# Patient Record
Sex: Female | Born: 1945 | Race: Black or African American | Hispanic: No | Marital: Married | Smoking: Former smoker
Health system: Southern US, Community
[De-identification: ages and names within clinical notes are randomized; demographics above are authoritative.]

## PROBLEM LIST (undated history)

## (undated) DIAGNOSIS — M47812 Spondylosis without myelopathy or radiculopathy, cervical region: Secondary | ICD-10-CM

## (undated) DIAGNOSIS — D649 Anemia, unspecified: Secondary | ICD-10-CM

## (undated) DIAGNOSIS — I872 Venous insufficiency (chronic) (peripheral): Secondary | ICD-10-CM

## (undated) DIAGNOSIS — G8929 Other chronic pain: Secondary | ICD-10-CM

## (undated) DIAGNOSIS — G4700 Insomnia, unspecified: Secondary | ICD-10-CM

## (undated) DIAGNOSIS — M79669 Pain in unspecified lower leg: Secondary | ICD-10-CM

## (undated) DIAGNOSIS — J309 Allergic rhinitis, unspecified: Secondary | ICD-10-CM

## (undated) DIAGNOSIS — M25561 Pain in right knee: Secondary | ICD-10-CM

## (undated) DIAGNOSIS — E559 Vitamin D deficiency, unspecified: Secondary | ICD-10-CM

## (undated) DIAGNOSIS — M1990 Unspecified osteoarthritis, unspecified site: Secondary | ICD-10-CM

## (undated) DIAGNOSIS — R3915 Urgency of urination: Secondary | ICD-10-CM

## (undated) DIAGNOSIS — M7989 Other specified soft tissue disorders: Secondary | ICD-10-CM

## (undated) DIAGNOSIS — F411 Generalized anxiety disorder: Secondary | ICD-10-CM

## (undated) DIAGNOSIS — E785 Hyperlipidemia, unspecified: Principal | ICD-10-CM

## (undated) DIAGNOSIS — N762 Acute vulvitis: Secondary | ICD-10-CM

## (undated) HISTORY — DX: Hyperlipidemia, unspecified: E78.5

## (undated) HISTORY — DX: Vitamin D deficiency, unspecified: E55.9

## (undated) HISTORY — DX: Allergic rhinitis, unspecified: J30.9

## (undated) HISTORY — PX: OTHER SURGICAL HISTORY: SHX169

## (undated) HISTORY — DX: Other specified soft tissue disorders: M79.89

## (undated) HISTORY — DX: Pain in right knee: M25.561

## (undated) HISTORY — DX: Unspecified osteoarthritis, unspecified site: M19.90

## (undated) HISTORY — PX: TUBAL LIGATION: SHX77

## (undated) HISTORY — DX: Anemia, unspecified: D64.9

## (undated) HISTORY — DX: Spondylosis without myelopathy or radiculopathy, cervical region: M47.812

## (undated) HISTORY — DX: Acute vulvitis: N76.2

## (undated) HISTORY — DX: Other specified soft tissue disorders: M79.669

## (undated) HISTORY — DX: Venous insufficiency (chronic) (peripheral): I87.2

## (undated) HISTORY — DX: Generalized anxiety disorder: F41.1

## (undated) HISTORY — DX: Other chronic pain: G89.29

## (undated) HISTORY — DX: Insomnia, unspecified: G47.00

## (undated) HISTORY — DX: Urgency of urination: R39.15

---

## 1998-01-05 ENCOUNTER — Ambulatory Visit (HOSPITAL_COMMUNITY): Admission: RE | Admit: 1998-01-05 | Discharge: 1998-01-05 | Payer: Self-pay | Admitting: Gastroenterology

## 1998-01-31 ENCOUNTER — Other Ambulatory Visit: Admission: RE | Admit: 1998-01-31 | Discharge: 1998-01-31 | Payer: Self-pay | Admitting: Obstetrics and Gynecology

## 1998-09-29 ENCOUNTER — Other Ambulatory Visit: Admission: RE | Admit: 1998-09-29 | Discharge: 1998-09-29 | Payer: Self-pay | Admitting: Obstetrics and Gynecology

## 1999-09-02 ENCOUNTER — Encounter: Payer: Self-pay | Admitting: Emergency Medicine

## 1999-09-02 ENCOUNTER — Emergency Department (HOSPITAL_COMMUNITY): Admission: EM | Admit: 1999-09-02 | Discharge: 1999-09-02 | Payer: Self-pay | Admitting: Emergency Medicine

## 1999-11-06 ENCOUNTER — Other Ambulatory Visit: Admission: RE | Admit: 1999-11-06 | Discharge: 1999-11-06 | Payer: Self-pay | Admitting: Obstetrics and Gynecology

## 2000-05-15 ENCOUNTER — Encounter: Admission: RE | Admit: 2000-05-15 | Discharge: 2000-05-15 | Payer: Self-pay | Admitting: Obstetrics and Gynecology

## 2000-05-15 ENCOUNTER — Encounter: Payer: Self-pay | Admitting: Obstetrics and Gynecology

## 2000-05-22 ENCOUNTER — Encounter: Payer: Self-pay | Admitting: Obstetrics and Gynecology

## 2000-05-22 ENCOUNTER — Encounter: Admission: RE | Admit: 2000-05-22 | Discharge: 2000-05-22 | Payer: Self-pay | Admitting: Obstetrics and Gynecology

## 2001-01-05 ENCOUNTER — Other Ambulatory Visit: Admission: RE | Admit: 2001-01-05 | Discharge: 2001-01-05 | Payer: Self-pay | Admitting: Obstetrics and Gynecology

## 2001-07-20 ENCOUNTER — Encounter: Admission: RE | Admit: 2001-07-20 | Discharge: 2001-07-20 | Payer: Self-pay | Admitting: Obstetrics and Gynecology

## 2001-07-20 ENCOUNTER — Encounter: Payer: Self-pay | Admitting: Obstetrics and Gynecology

## 2002-06-10 ENCOUNTER — Other Ambulatory Visit: Admission: RE | Admit: 2002-06-10 | Discharge: 2002-06-10 | Payer: Self-pay | Admitting: Obstetrics and Gynecology

## 2002-07-21 ENCOUNTER — Encounter: Admission: RE | Admit: 2002-07-21 | Discharge: 2002-07-21 | Payer: Self-pay | Admitting: Obstetrics and Gynecology

## 2002-07-21 ENCOUNTER — Encounter: Payer: Self-pay | Admitting: Obstetrics and Gynecology

## 2004-01-27 ENCOUNTER — Encounter: Admission: RE | Admit: 2004-01-27 | Discharge: 2004-01-27 | Payer: Self-pay | Admitting: Obstetrics and Gynecology

## 2004-02-22 ENCOUNTER — Other Ambulatory Visit: Admission: RE | Admit: 2004-02-22 | Discharge: 2004-02-22 | Payer: Self-pay | Admitting: Obstetrics and Gynecology

## 2004-04-12 ENCOUNTER — Emergency Department (HOSPITAL_COMMUNITY): Admission: EM | Admit: 2004-04-12 | Discharge: 2004-04-12 | Payer: Self-pay | Admitting: Family Medicine

## 2004-11-07 ENCOUNTER — Ambulatory Visit (HOSPITAL_COMMUNITY): Admission: RE | Admit: 2004-11-07 | Discharge: 2004-11-07 | Payer: Self-pay | Admitting: Obstetrics and Gynecology

## 2005-03-11 ENCOUNTER — Other Ambulatory Visit: Admission: RE | Admit: 2005-03-11 | Discharge: 2005-03-11 | Payer: Self-pay | Admitting: Obstetrics and Gynecology

## 2005-04-22 ENCOUNTER — Encounter: Admission: RE | Admit: 2005-04-22 | Discharge: 2005-04-22 | Payer: Self-pay | Admitting: Obstetrics and Gynecology

## 2005-08-14 ENCOUNTER — Emergency Department (HOSPITAL_COMMUNITY): Admission: EM | Admit: 2005-08-14 | Discharge: 2005-08-14 | Payer: Self-pay | Admitting: Emergency Medicine

## 2005-09-20 ENCOUNTER — Ambulatory Visit: Payer: Self-pay | Admitting: Internal Medicine

## 2006-04-23 ENCOUNTER — Other Ambulatory Visit: Admission: RE | Admit: 2006-04-23 | Discharge: 2006-04-23 | Payer: Self-pay | Admitting: Obstetrics and Gynecology

## 2006-04-24 ENCOUNTER — Encounter: Admission: RE | Admit: 2006-04-24 | Discharge: 2006-04-24 | Payer: Self-pay | Admitting: Obstetrics and Gynecology

## 2006-05-02 ENCOUNTER — Ambulatory Visit: Payer: Self-pay | Admitting: Internal Medicine

## 2006-05-06 ENCOUNTER — Ambulatory Visit: Payer: Self-pay | Admitting: Internal Medicine

## 2006-06-05 ENCOUNTER — Ambulatory Visit: Payer: Self-pay | Admitting: Internal Medicine

## 2006-06-19 ENCOUNTER — Ambulatory Visit: Payer: Self-pay | Admitting: Internal Medicine

## 2007-04-28 ENCOUNTER — Encounter: Admission: RE | Admit: 2007-04-28 | Discharge: 2007-04-28 | Payer: Self-pay | Admitting: Obstetrics and Gynecology

## 2007-04-30 ENCOUNTER — Encounter: Admission: RE | Admit: 2007-04-30 | Discharge: 2007-04-30 | Payer: Self-pay | Admitting: Obstetrics and Gynecology

## 2008-02-11 ENCOUNTER — Emergency Department (HOSPITAL_COMMUNITY): Admission: EM | Admit: 2008-02-11 | Discharge: 2008-02-11 | Payer: Self-pay | Admitting: Emergency Medicine

## 2008-02-12 ENCOUNTER — Telehealth (INDEPENDENT_AMBULATORY_CARE_PROVIDER_SITE_OTHER): Payer: Self-pay | Admitting: *Deleted

## 2008-02-13 ENCOUNTER — Ambulatory Visit: Payer: Self-pay | Admitting: Internal Medicine

## 2008-02-23 ENCOUNTER — Ambulatory Visit: Payer: Self-pay | Admitting: Internal Medicine

## 2008-02-23 LAB — CONVERTED CEMR LAB
ALT: 16 units/L (ref 0–35)
AST: 21 units/L (ref 0–37)
Albumin: 3.9 g/dL (ref 3.5–5.2)
Alkaline Phosphatase: 66 units/L (ref 39–117)
Basophils Absolute: 0 10*3/uL (ref 0.0–0.1)
Bilirubin, Direct: 0.1 mg/dL (ref 0.0–0.3)
CO2: 32 meq/L (ref 19–32)
Cholesterol: 167 mg/dL (ref 0–200)
GFR calc non Af Amer: 90 mL/min
Glucose, Bld: 97 mg/dL (ref 70–99)
HCT: 35.5 % — ABNORMAL LOW (ref 36.0–46.0)
HDL: 54.6 mg/dL (ref >39.0)
Lymphocytes Relative: 34.4 % (ref 12.0–46.0)
Neutro Abs: 2.2 10*3/uL (ref 1.4–7.7)
Nitrite: NEGATIVE
Potassium: 4.2 meq/L (ref 3.5–5.1)
RBC: 3.65 M/uL — ABNORMAL LOW (ref 3.87–5.11)
RDW: 11.8 % (ref 11.5–14.6)
Sodium: 142 meq/L (ref 135–145)
Triglycerides: 31 mg/dL (ref 0–149)
Urine Glucose: NEGATIVE mg/dL
Urobilinogen, UA: 0.2 (ref 0.0–1.0)
WBC: 3.9 10*3/uL — ABNORMAL LOW (ref 4.5–10.5)

## 2008-02-25 ENCOUNTER — Ambulatory Visit: Payer: Self-pay | Admitting: Internal Medicine

## 2008-02-25 ENCOUNTER — Telehealth (INDEPENDENT_AMBULATORY_CARE_PROVIDER_SITE_OTHER): Payer: Self-pay | Admitting: *Deleted

## 2008-02-25 DIAGNOSIS — N3000 Acute cystitis without hematuria: Secondary | ICD-10-CM | POA: Insufficient documentation

## 2008-02-25 DIAGNOSIS — R109 Unspecified abdominal pain: Secondary | ICD-10-CM | POA: Insufficient documentation

## 2008-03-08 ENCOUNTER — Telehealth: Payer: Self-pay | Admitting: Internal Medicine

## 2008-03-24 ENCOUNTER — Ambulatory Visit: Payer: Self-pay | Admitting: Internal Medicine

## 2008-03-24 DIAGNOSIS — E785 Hyperlipidemia, unspecified: Secondary | ICD-10-CM

## 2008-03-24 DIAGNOSIS — J309 Allergic rhinitis, unspecified: Secondary | ICD-10-CM

## 2008-03-24 DIAGNOSIS — N959 Unspecified menopausal and perimenopausal disorder: Secondary | ICD-10-CM | POA: Insufficient documentation

## 2008-03-24 HISTORY — DX: Unspecified menopausal and perimenopausal disorder: N95.9

## 2008-03-24 HISTORY — DX: Hyperlipidemia, unspecified: E78.5

## 2008-03-24 HISTORY — DX: Allergic rhinitis, unspecified: J30.9

## 2008-05-24 ENCOUNTER — Encounter: Admission: RE | Admit: 2008-05-24 | Discharge: 2008-05-24 | Payer: Self-pay | Admitting: Obstetrics and Gynecology

## 2009-05-25 ENCOUNTER — Encounter: Admission: RE | Admit: 2009-05-25 | Discharge: 2009-05-25 | Payer: Self-pay | Admitting: Obstetrics and Gynecology

## 2009-05-26 LAB — CONVERTED CEMR LAB: Pap Smear: NORMAL

## 2009-06-07 ENCOUNTER — Ambulatory Visit: Payer: Self-pay | Admitting: Internal Medicine

## 2009-06-07 DIAGNOSIS — R5381 Other malaise: Secondary | ICD-10-CM

## 2009-06-07 DIAGNOSIS — R5383 Other fatigue: Secondary | ICD-10-CM

## 2009-06-07 LAB — CONVERTED CEMR LAB
Albumin: 4 g/dL (ref 3.5–5.2)
Alkaline Phosphatase: 61 units/L (ref 39–117)
Basophils Relative: 0.2 % (ref 0.0–3.0)
CO2: 30 meq/L (ref 19–32)
Cholesterol: 198 mg/dL (ref 0–200)
Eosinophils Relative: 1.6 % (ref 0.0–5.0)
HDL: 65.6 mg/dL (ref >39.00)
LDL Cholesterol: 122 mg/dL — ABNORMAL HIGH (ref 0–99)
Lymphocytes Relative: 35.8 % (ref 12.0–46.0)
Lymphs Abs: 1.4 10*3/uL (ref 0.7–4.0)
Monocytes Absolute: 0.3 10*3/uL (ref 0.1–1.0)
Monocytes Relative: 8.3 % (ref 3.0–12.0)
Neutro Abs: 2 10*3/uL (ref 1.4–7.7)
Neutrophils Relative %: 54.1 % (ref 43.0–77.0)
Nitrite: NEGATIVE
Platelets: 119 10*3/uL — ABNORMAL LOW (ref 150.0–400.0)
Potassium: 3.9 meq/L (ref 3.5–5.1)
Saturation Ratios: 34.8 % (ref 20.0–50.0)
Sodium: 142 meq/L (ref 135–145)
TSH: 2.47 microintl units/mL (ref 0.35–5.50)
Total Bilirubin: 1 mg/dL (ref 0.3–1.2)
Triglycerides: 50 mg/dL (ref 0.0–149.0)
VLDL: 10 mg/dL (ref 0.0–40.0)
Vitamin B-12: 259 pg/mL (ref 211–911)
WBC: 3.8 10*3/uL — ABNORMAL LOW (ref 4.5–10.5)

## 2010-01-24 ENCOUNTER — Ambulatory Visit: Payer: Self-pay | Admitting: Internal Medicine

## 2010-01-24 DIAGNOSIS — G4700 Insomnia, unspecified: Secondary | ICD-10-CM

## 2010-01-24 DIAGNOSIS — F411 Generalized anxiety disorder: Secondary | ICD-10-CM | POA: Insufficient documentation

## 2010-01-24 HISTORY — DX: Generalized anxiety disorder: F41.1

## 2010-01-24 HISTORY — DX: Insomnia, unspecified: G47.00

## 2010-05-29 ENCOUNTER — Ambulatory Visit: Payer: Self-pay | Admitting: Internal Medicine

## 2010-05-29 LAB — CONVERTED CEMR LAB
ALT: 16 units/L (ref 0–35)
AST: 25 units/L (ref 0–37)
Albumin: 4.1 g/dL (ref 3.5–5.2)
Alkaline Phosphatase: 75 units/L (ref 39–117)
Basophils Absolute: 0 10*3/uL (ref 0.0–0.1)
Bilirubin, Direct: 0.1 mg/dL (ref 0.0–0.3)
CO2: 31 meq/L (ref 19–32)
Calcium: 9.9 mg/dL (ref 8.4–10.5)
Chloride: 106 meq/L (ref 96–112)
Direct LDL: 116.3 mg/dL
Hemoglobin: 12.1 g/dL (ref 12.0–15.0)
Lymphs Abs: 1.2 10*3/uL (ref 0.7–4.0)
MCHC: 33 g/dL (ref 30.0–36.0)
MCV: 99 fL (ref 78.0–100.0)
Monocytes Absolute: 0.2 10*3/uL (ref 0.1–1.0)
Monocytes Relative: 6.7 % (ref 3.0–12.0)
Potassium: 4.4 meq/L (ref 3.5–5.1)
RDW: 13.2 % (ref 11.5–14.6)
Sodium: 143 meq/L (ref 135–145)
Total CHOL/HDL Ratio: 3
Total Protein: 7.5 g/dL (ref 6.0–8.3)
VLDL: 7.2 mg/dL (ref 0.0–40.0)

## 2010-06-12 ENCOUNTER — Encounter: Admission: RE | Admit: 2010-06-12 | Discharge: 2010-06-12 | Payer: Self-pay | Admitting: Obstetrics and Gynecology

## 2010-07-27 ENCOUNTER — Ambulatory Visit: Payer: Self-pay | Admitting: Internal Medicine

## 2010-07-27 DIAGNOSIS — J019 Acute sinusitis, unspecified: Secondary | ICD-10-CM

## 2010-07-27 HISTORY — DX: Acute sinusitis, unspecified: J01.90

## 2010-09-15 ENCOUNTER — Encounter: Payer: Self-pay | Admitting: Obstetrics and Gynecology

## 2010-09-25 NOTE — Assessment & Plan Note (Signed)
Summary: HEADACHE FATIGUE---STC   Vital Signs:  Patient profile:   65 year old female Height:      65.5 inches Weight:      190 pounds BMI:     31.25 O2 Sat:      97 % on Room air Temp:     96.8 degrees F oral Pulse rate:   67 / minute BP sitting:   132 / 80  (left arm) Cuff size:   regular  Vitals Entered ByZella Ball Ewing (January 24, 2010 10:31 AM)  O2 Flow:  Room air CC: Headaches, fatigue/RE   CC:  Headaches and fatigue/RE.  History of Present Illness: here with ongoing ? worsening fatigue and headches for 3 wks, without obvious eitology it seems;  has had marked increase stress and insomnia, but denies worsening depressive symptoms, suicidal ideation or panic;  denies fever, ST, neck pain or stiffness, cough and Pt denies CP, sob, doe, wheezing, orthopnea, pnd, worsening LE edema, palps, dizziness or syncope Pt denies new neuro symptoms such as headache, facial or extremity weakness   No chills, n/v/d, joint pains, wt loss or night sweats.  Has recent recurrent hives controlled with xyzal as needed, but no new meds, or exposures;  no tongue swelling or wheezing.    Problems Prior to Update: 1)  Insomnia-sleep Disorder-unspec  (ICD-780.52) 2)  Anxiety  (ICD-300.00) 3)  Fatigue  (ICD-780.79) 4)  Menopausal Disorder  (ICD-627.9) 5)  Preventive Health Care  (ICD-V70.0) 6)  Hyperlipidemia  (ICD-272.4) 7)  Allergic Rhinitis  (ICD-477.9) 8)  Abdominal Pain, Unspecified Site  (ICD-789.00) 9)  Acute Cystitis  (ICD-595.0)  Medications Prior to Update: 1)  Aspir-Low 81 Mg Tbec (Aspirin) .Marland Kitchen.. 1 By Mouth Once Daily 2)  Prometrium 100 Mg Caps (Progesterone Micronized) .Marland Kitchen.. 1 By Mouth Once Daily 3)  Nuvaring 0.12-0.015 Mg/24hr Ring (Etonogestrel-Ethinyl Estradiol) .... Use Asd 1 Q 3 Months  Current Medications (verified): 1)  Aspir-Low 81 Mg Tbec (Aspirin) .Marland Kitchen.. 1 By Mouth Once Daily 2)  Prometrium 100 Mg Caps (Progesterone Micronized) .Marland Kitchen.. 1 By Mouth Once Daily 3)  Nuvaring 0.12-0.015  Mg/24hr Ring (Etonogestrel-Ethinyl Estradiol) .... Use Asd 1 Q 3 Months 4)  Xyzal 5 Mg Tabs (Levocetirizine Dihydrochloride) .Marland Kitchen.. 1 By Mouth Once Daily 5)  Fluoxetine Hcl 20 Mg Caps (Fluoxetine Hcl) .Marland Kitchen.. 1po Once Daily 6)  Zolpidem Tartrate 12.5 Mg Cr-Tabs (Zolpidem Tartrate) .Marland Kitchen.. 1 By Mouth At Bedtime As Needed  Allergies (verified): 1)  ! * Tetanus  Past History:  Past Surgical History: Last updated: 03/24/2008 Denies surgical history  Social History: Last updated: 03/24/2008 Former Smoker Alcohol use-no 2 children Married retired Psychologist, occupational  Risk Factors: Smoking Status: quit (03/24/2008)  Past Medical History: Allergic rhinitis Hyperlipidemia c-spine DJD Anxiety  Review of Systems       all otherwise negative per pt -    Physical Exam  General:  alert and overweight-appearing.   Head:  normocephalic and atraumatic.   Eyes:  vision grossly intact, pupils equal, and pupils round.   Ears:  R ear normal and L ear normal.   Nose:  no external deformity and no nasal discharge.   Mouth:  no gingival abnormalities and pharynx pink and moist.   Neck:  supple and no masses.   Lungs:  normal respiratory effort and normal breath sounds.   Heart:  normal rate and regular rhythm.   Abdomen:  soft, non-tender, normal bowel sounds, and no splenomegaly.   Msk:  no joint tenderness and no joint  swelling.   Extremities:  no edema, no erythema  Neurologic:  cranial nerves II-XII intact and strength normal in all extremities.   Skin:  no rashes.     Impression & Recommendations:  Problem # 1:  ANXIETY (ICD-300.00)  Her updated medication list for this problem includes:    Fluoxetine Hcl 20 Mg Caps (Fluoxetine hcl) .Marland Kitchen... 1po once daily treat as above, f/u any worsening signs or symptoms   Problem # 2:  INSOMNIA-SLEEP DISORDER-UNSPEC (ICD-780.52)  Her updated medication list for this problem includes:    Zolpidem Tartrate 12.5 Mg Cr-tabs (Zolpidem tartrate) .Marland Kitchen... 1 by mouth  at bedtime as needed treat as above, f/u any worsening signs or symptoms   Problem # 3:  FATIGUE (ICD-780.79) exam benign, last visit labs reviewed with pt; follow with expectant management   Problem # 4:  HYPERLIPIDEMIA (ICD-272.4)  Labs Reviewed: SGOT: 21 (06/07/2009)   SGPT: 15 (06/07/2009)   HDL:65.60 (06/07/2009), 54.6 (02/23/2008)  LDL:122 (06/07/2009), 106 (02/23/2008)  Chol:198 (06/07/2009), 167 (02/23/2008)  Trig:50.0 (06/07/2009), 31 (02/23/2008) to cont diet for now  Complete Medication List: 1)  Aspir-low 81 Mg Tbec (Aspirin) .Marland Kitchen.. 1 by mouth once daily 2)  Prometrium 100 Mg Caps (Progesterone micronized) .Marland Kitchen.. 1 by mouth once daily 3)  Nuvaring 0.12-0.015 Mg/24hr Ring (Etonogestrel-ethinyl estradiol) .... Use asd 1 q 3 months 4)  Xyzal 5 Mg Tabs (Levocetirizine dihydrochloride) .Marland Kitchen.. 1 by mouth once daily 5)  Fluoxetine Hcl 20 Mg Caps (Fluoxetine hcl) .Marland Kitchen.. 1po once daily 6)  Zolpidem Tartrate 12.5 Mg Cr-tabs (Zolpidem tartrate) .Marland Kitchen.. 1 by mouth at bedtime as needed  Patient Instructions: 1)  Please take all new medications as prescribed 2)  Continue all previous medications as before this visit  3)  please follow as low cholesterol diet as you can 4)  Please schedule a follow-up appointment in 4 months with CPX labs Prescriptions: ZOLPIDEM TARTRATE 12.5 MG CR-TABS (ZOLPIDEM TARTRATE) 1 by mouth at bedtime as needed  #30 x 5   Entered and Authorized by:   Corwin Levins MD   Signed by:   Corwin Levins MD on 01/24/2010   Method used:   Print then Give to Patient   RxID:   [id number] FLUOXETINE HCL 20 MG CAPS (FLUOXETINE HCL) 1po once daily  #30 x 11   Entered and Authorized by:   Corwin Levins MD   Signed by:   Corwin Levins MD on 01/24/2010   Method used:   Print then Give to Patient   RxID:   [id number]

## 2010-09-25 NOTE — Assessment & Plan Note (Signed)
Summary: YEARLY FU/ MEDICARE / [employer] [place]   #   Vital Signs:  Patient profile:   65 year old female Height:      65.5 inches Weight:      191.25 pounds BMI:     31.45 O2 Sat:      98 % on Room air Temp:     98 degrees F oral Pulse rate:   64 / minute BP sitting:   100 / 62  (left arm) Cuff size:   regular  Vitals Entered By: Zella Ball Ewing CMA Duncan Dull) (May 29, 2010 10:52 AM)  O2 Flow:  Room air  Preventive Care Screening     has appt for f/u mammogram next wk  CC: yearly/RE/wellness   CC:  yearly/RE/wellness.  History of Present Illness: here for wellness and f/u - never took the prozac and does not want to try at this time;  denies worsening depressive symptoms, suicidal ideation, anixety or panic.  Sleep issues resolves as well and the dose of zolpidem 12.5 cuased hangover the next day iwth grogginess;  Pt denies CP, worsening sob, doe, wheezing, orthopnea, pnd, worsening LE edema, palps, dizziness or syncope  Pt denies new neuro symptoms such as headache, facial or extremity weakness  Pt denies polydipsia, polyuria.  Overall good compliance with meds, trying to follow low chol, diet, wt stable, little excercise however  No fever, wt loss, night sweats, loss of appetite or other constitutional symptoms  Has good results with the now generic xyzal as needed for nasall allergy symtpoms.  Denies hypersomnia or snore at night  Here for wellness Diet: Heart Healthy or DM if diabetic Physical Activities: Sedentary, but does go to the gym a few times per wk - treadmill, walking trails Depression/mood screen: Negative Hearing: Intact bilateral Visual Acuity: Grossly normal, gets exam yearly, wears glasses/nersighted ADL's: Capable  Fall Risk: None Home Safety: Good Cognitive Impairment:  Gen appearance, affect, speech, memory, attention & motor skills grossly intact End-of-Life Planning: Advance directive - Full code/I agree   Preventive Screening-Counseling & Management     Drug Use:  no.    Problems Prior to Update: 1)  Insomnia-sleep Disorder-unspec  (ICD-780.52) 2)  Anxiety  (ICD-300.00) 3)  Fatigue  (ICD-780.79) 4)  Menopausal Disorder  (ICD-627.9) 5)  Preventive Health Care  (ICD-V70.0) 6)  Hyperlipidemia  (ICD-272.4) 7)  Allergic Rhinitis  (ICD-477.9) 8)  Abdominal Pain, Unspecified Site  (ICD-789.00) 9)  Acute Cystitis  (ICD-595.0)  Medications Prior to Update: 1)  Aspir-Low 81 Mg Tbec (Aspirin) .Marland Kitchen.. 1 By Mouth Once Daily 2)  Prometrium 100 Mg Caps (Progesterone Micronized) .Marland Kitchen.. 1 By Mouth Once Daily 3)  Nuvaring 0.12-0.015 Mg/24hr Ring (Etonogestrel-Ethinyl Estradiol) .... Use Asd 1 Q 3 Months 4)  Xyzal 5 Mg Tabs (Levocetirizine Dihydrochloride) .Marland Kitchen.. 1 By Mouth Once Daily 5)  Fluoxetine Hcl 20 Mg Caps (Fluoxetine Hcl) .Marland Kitchen.. 1po Once Daily 6)  Zolpidem Tartrate 12.5 Mg Cr-Tabs (Zolpidem Tartrate) .Marland Kitchen.. 1 By Mouth At Bedtime As Needed  Current Medications (verified): 1)  Aspir-Low 81 Mg Tbec (Aspirin) .Marland Kitchen.. 1 By Mouth Once Daily 2)  Prometrium 100 Mg Caps (Progesterone Micronized) .Marland Kitchen.. 1 By Mouth Once Daily 3)  Nuvaring 0.12-0.015 Mg/24hr Ring (Etonogestrel-Ethinyl Estradiol) .... Use Asd 1 Q 3 Months 4)  Xyzal 5 Mg Tabs (Levocetirizine Dihydrochloride) .Marland Kitchen.. 1 By Mouth Once Daily  Allergies (verified): 1)  ! * Tetanus  Past History:  Family History: Last updated: 03/24/2008 mother with breast cancer sister with uterine cancer  Social  History: Last updated: 05/29/2010 Former Smoker Alcohol use-no 2 children Married retired Psychologist, occupational Drug use-no  Risk Factors: Smoking Status: quit (03/24/2008)  Past Medical History: Allergic rhinitis Hyperlipidemia c-spine DJD Anxiety  MD roster:  optho:  Dr Gwen Pounds                   GYN:  Dr Pennie Rushing                   Derm:  dr Joseph Art                   ortho:  Dr Darrelyn Hillock                   GI:  Dr Juanda Chance  Past Surgical History: Reviewed history from 03/24/2008 and no changes required. Denies  surgical history  Family History: Reviewed history from 03/24/2008 and no changes required. mother with breast cancer sister with uterine cancer  Social History: Reviewed history from 03/24/2008 and no changes required. 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         all otherwise negative per pt -  except has some Left lower back pain now resolved today in the past 3 days  Physical Exam  General:  alert and overweight-appearing.   Head:  normocephalic and atraumatic.   Eyes:  vision grossly intact, pupils equal, and pupils round.   Ears:  R ear normal and L ear normal.   Nose:  no external deformity and no nasal discharge.   Mouth:  no gingival abnormalities and pharynx pink and moist.   Neck:  supple and no masses.   Lungs:  normal respiratory effort and normal breath sounds.   Heart:  normal rate and regular rhythm.   Abdomen:  soft, non-tender, and normal bowel sounds.   Msk:  no joint tenderness and no joint swelling.   Extremities:  no edema, no erythema  Neurologic:  cognitive intact to orientation, recall, naming, and repetition , cranial nerves II-XII intact and strength normal in all extremities.   Skin:  color normal and no rashes.   Psych:  not depressed appearing and slightly anxious.     Impression & Recommendations:  Problem # 1:  Preventive Health Care (ICD-V70.0)  Overall doing well, age appropriate education and counseling updated and referral for appropriate  preventive services done unless declined, immunizations up to date or declined, diet counseling done if overweight, urged to quit smoking if smokes , most recent labs reviewed and current ordered if appropriate, ecg reviewed or declined (interpretation per ECG scanned in the EMR if done); information regarding Medicare Prevention requirements given if appropriate; speciality referrals updated as appropriate   Orders: Medicare -1st Annual Wellness Visit [phone])  Problem # 2:  FATIGUE (ICD-780.79)  exam benign, to check labs below; follow with expectant management  - likely multifactorial due to comorbids  Orders: TLB-BMP (Basic Metabolic Panel-BMET) (80048-METABOL) TLB-CBC Platelet - w/Differential (85025-CBCD) TLB-Hepatic/Liver Function Pnl (80076-HEPATIC) TLB-TSH (Thyroid Stimulating Hormone) (84443-TSH) TLB-Sedimentation Rate (ESR) (85652-ESR)  Problem # 3:  HYPERLIPIDEMIA (ICD-272.4)  Labs Reviewed: SGOT: 21 (06/07/2009)   SGPT: 15 (06/07/2009)  HDL:65.60 (06/07/2009), 54.6 (02/23/2008)  LDL:122 (06/07/2009), 106 (02/23/2008)  Chol:198 (06/07/2009), 167 (02/23/2008)  Trig:50.0 (06/07/2009), 31 (02/23/2008) mild, Pt to continue diet efforts,; to check labs - goal LDL less than 100  Orders: TLB-Lipid Panel (80061-LIPID)  Problem # 4:  INSOMNIA-SLEEP DISORDER-UNSPEC (ICD-780.52)  The following medications were removed from the medication list:    Zolpidem Tartrate 12.5 Mg Cr-tabs (Zolpidem tartrate) .Marland Kitchen... 1 by mouth at bedtime as needed resolved - stable overall by hx and exam, ok to continue meds/tx as is  - none needed at this time  Problem # 5:  ANXIETY (ICD-300.00)  The following medications were removed from the medication list:    Fluoxetine Hcl 20 Mg Caps (Fluoxetine hcl) .Marland Kitchen... 1po once daily stable overall by hx and exam, ok to continue meds/tx as is - declines further meds at this time  Problem # 6:  ALLERGIC RHINITIS (ICD-477.9)  Her updated medication list for this  problem includes:    Xyzal 5 Mg Tabs (Levocetirizine dihydrochloride) .Marland Kitchen... 1 by mouth once daily stable overall by hx and exam, ok to continue meds/tx as is   Orders: Prescription Created Electronically [phone])  Complete Medication List: 1)  Aspir-low 81 Mg Tbec (Aspirin) .Marland Kitchen.. 1 by mouth once daily 2)  Prometrium 100 Mg Caps (Progesterone micronized) .Marland Kitchen.. 1 by mouth once daily 3)  Nuvaring 0.12-0.015 Mg/24hr Ring (Etonogestrel-ethinyl estradiol) .... Use asd 1 q 3 months 4)  Xyzal 5 Mg Tabs (Levocetirizine dihydrochloride) .Marland Kitchen.. 1 by mouth once daily  Other Orders: Flu Vaccine 91yrs + MEDICARE PATIENTS (K7425) Administration Flu vaccine - MCR (Z5638)  Patient Instructions: 1)  you had the flu shot today 2)  Please go to the Lab in the basement for your blood and/or urine tests today 3)  Please call the number on the [facility] for results of your testing  4)  Continue all previous medications as before this visit  5)  Please schedule a follow-up appointment in 1 yr, or sooner if needed Prescriptions: XYZAL 5 MG TABS (LEVOCETIRIZINE DIHYDROCHLORIDE) 1 by mouth once daily  #90 x 3   Entered and Authorized by:   Corwin Levins MD   Signed by:   Corwin Levins MD on 05/29/2010   Method used:   Print then Give to Patient   RxID:   [id number]   Flu Vaccine Consent Questions     Do you have a history of severe allergic reactions to this vaccine? no    Any prior history of allergic reactions to egg and/or gelatin? no    Do you have a sensitivity to the preservative Thimersol? no    Do you have a past history of Guillan-Barre Syndrome? no    Do you currently have an acute febrile illness? no    Have you ever had a severe reaction to latex? no    Vaccine information given and explained to patient? yes    Are you currently pregnant? no    Lot Number:[id number]   Exp Date:02/23/2011   Site Given  Left Deltoid IMdflu

## 2010-09-25 NOTE — Assessment & Plan Note (Signed)
Summary: sore throat/congested/cd   Vital Signs:  Patient profile:   65 year old female Height:      65.5 inches Weight:      188.38 pounds BMI:     30.98 O2 Sat:      93 % on Room air Temp:     99.1 degrees F oral Pulse rate:   78 / minute BP sitting:   100 / 62  (left arm) Cuff size:   regular  Vitals Entered By: Zella Ball Ewing CMA Duncan Dull) (July 27, 2010 2:40 PM)  O2 Flow:  Room air CC: Sore Throat, congestion/RE   CC:  Sore Throat and congestion/RE.  History of Present Illness: here with acute onset 3 days fever, headache, ST, sinus pain, pressure, and greenish d/c;  and today with increased prod cough greenish/yellow sputum as well, .Pt denies CP, worsening sob, doe, wheezing, orthopnea, pnd, worsening LE edema, palps, dizziness or syncope  Pt denies new neuro symptoms such as headache, facial or extremity weakness  Pt denies polydipsia, polyuria  Overall good compliance with meds, trying to follow low chol diet, wt stable, little excercise however  Has had over a month of nasal allergy symtpoms as well with clearish drainaage, without pain or fever.  Trying to follow lower chol diet.  Problems Prior to Update: 1)  Bronchitis-acute  (ICD-466.0) 2)  Sinusitis- Acute-nos  (ICD-461.9) 3)  Insomnia-sleep Disorder-unspec  (ICD-780.52) 4)  Anxiety  (ICD-300.00) 5)  Fatigue  (ICD-780.79) 6)  Menopausal Disorder  (ICD-627.9) 7)  Preventive Health Care  (ICD-V70.0) 8)  Hyperlipidemia  (ICD-272.4) 9)  Allergic Rhinitis  (ICD-477.9) 10)  Abdominal Pain, Unspecified Site  (ICD-789.00) 11)  Acute Cystitis  (ICD-595.0)  Medications Prior to Update: 1)  Aspir-Low 81 Mg Tbec (Aspirin) .Marland Kitchen.. 1 By Mouth Once Daily 2)  Prometrium 100 Mg Caps (Progesterone Micronized) .Marland Kitchen.. 1 By Mouth Once Daily 3)  Nuvaring 0.12-0.015 Mg/24hr Ring (Etonogestrel-Ethinyl Estradiol) .... Use Asd 1 Q 3 Months 4)  Xyzal 5 Mg Tabs (Levocetirizine Dihydrochloride) .Marland Kitchen.. 1 By Mouth Once Daily  Current Medications  (verified): 1)  Aspir-Low 81 Mg Tbec (Aspirin) .Marland Kitchen.. 1 By Mouth Once Daily 2)  Prometrium 100 Mg Caps (Progesterone Micronized) .Marland Kitchen.. 1 By Mouth Once Daily 3)  Nuvaring 0.12-0.015 Mg/24hr Ring (Etonogestrel-Ethinyl Estradiol) .... Use Asd 1 Q 3 Months 4)  Xyzal 5 Mg Tabs (Levocetirizine Dihydrochloride) .Marland Kitchen.. 1 By Mouth Once Daily 5)  Azithromycin 250 Mg Tabs (Azithromycin) .... 2po Qd For 1 Day, Then 1po Qd For 4days, Then Stop 6)  Tessalon Perles 100 Mg Caps (Benzonatate) .Marland Kitchen.. 1-2 By Mouth Three Times A Day As Needed  Allergies (verified): 1)  ! 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   Eyes:  vision grossly intact, pupils equal, and pupils round.   Ears:  bilat tm's red, sinus tender Nose:  nasal dischargemucosal pallor and mucosal edema.   Mouth:  pharyngeal erythema and fair dentition.   Neck:  supple and cervical lymphadenopathy.   Lungs:  normal respiratory effort and normal breath sounds.   Heart:  normal rate and regular rhythm.   Extremities:  no edema, no erythema    Impression & Recommendations:  Problem # 1:  BRONCHITIS-ACUTE (ICD-466.0) Assessment New  Her updated medication list for this problem includes:    Azithromycin 250 Mg Tabs (Azithromycin) .Marland Kitchen... 2po qd for 1 day, then 1po qd for  4days, then stop    Tessalon Perles 100 Mg Caps (Benzonatate) .Marland Kitchen... 1-2 by mouth three times a day as needed treat as above, f/u any worsening signs or symptoms   Problem # 2:  SINUSITIS- ACUTE-NOS (ICD-461.9)  Her updated medication list for this problem includes:    Azithromycin 250 Mg Tabs (Azithromycin) .Marland Kitchen... 2po qd for 1 day, then 1po qd for 4days, then stop    Tessalon Perles 100 Mg Caps (Benzonatate) .Marland Kitchen... 1-2 by mouth three times a day as needed treat as above, f/u any worsening signs or symptoms   Problem # 3:  ALLERGIC RHINITIS (ICD-477.9)  Her updated medication list for this problem includes:    Xyzal 5 Mg Tabs (Levocetirizine dihydrochloride) .Marland Kitchen... 1 by mouth once daily to re-start med, o/w stable overall by hx and exam, ok to continue meds/tx as is   Problem # 4:  HYPERLIPIDEMIA (ICD-272.4)  Labs Reviewed: SGOT: 25 (05/29/2010)   SGPT: 16 (05/29/2010)   HDL:80.80 (05/29/2010), 65.60 (06/07/2009)  LDL:122 (06/07/2009), 106 (02/23/2008)  Chol:205 (05/29/2010), 198 (06/07/2009)  Trig:36.0 (05/29/2010), 50.0 (06/07/2009) d/w pt - declines statin - for lower chol diet, Pt to continue diet efforts,  to check labs next visit - goal LDL less than 100  Complete Medication List: 1)  Aspir-low 81 Mg Tbec (Aspirin) .Marland Kitchen.. 1 by mouth once daily 2)  Prometrium 100 Mg Caps (Progesterone micronized) .Marland Kitchen.. 1 by mouth once daily 3)  Nuvaring 0.12-0.015 Mg/24hr Ring (Etonogestrel-ethinyl estradiol) .... Use asd 1 q 3 months 4)  Xyzal 5 Mg Tabs (Levocetirizine dihydrochloride) .Marland Kitchen.. 1 by mouth once daily 5)  Azithromycin 250 Mg Tabs (Azithromycin) .... 2po qd for 1 day, then 1po qd for 4days, then stop 6)  Tessalon Perles 100 Mg Caps (Benzonatate) .Marland Kitchen.. 1-2 by mouth three times a day as needed  Patient Instructions: 1)  Please take all new medications as prescribed 2)  Continue all previous medications as before this visit  3)  Please follow lower chol diet 4)  You can also use Mucinex OTC  or it's generic for congestion  5)  Please schedule a follow-up appointment as needed. Prescriptions: TESSALON PERLES 100 MG CAPS (BENZONATATE) 1-2 by mouth three times a day as needed  #60 x 0   Entered and Authorized by:   Corwin Levins MD   Signed by:   Corwin Levins MD on 07/27/2010   Method used:   Print then Give to Patient   RxID:   [id number] AZITHROMYCIN 250 MG TABS (AZITHROMYCIN) 2po qd for 1 day, then 1po qd for 4days, then  stop  #6 x 1   Entered and Authorized by:   Corwin Levins MD   Signed by:   Corwin Levins MD on 07/27/2010   Method used:   Print then Give to Patient   RxID:   [id number]    Orders Added: 1)  Est. Patient Level IV [08657]

## 2010-10-02 ENCOUNTER — Encounter: Payer: Self-pay | Admitting: Internal Medicine

## 2010-10-02 ENCOUNTER — Ambulatory Visit (INDEPENDENT_AMBULATORY_CARE_PROVIDER_SITE_OTHER): Payer: Medicare Other | Admitting: Internal Medicine

## 2010-10-02 DIAGNOSIS — F411 Generalized anxiety disorder: Secondary | ICD-10-CM

## 2010-10-02 DIAGNOSIS — M79609 Pain in unspecified limb: Secondary | ICD-10-CM

## 2010-10-02 DIAGNOSIS — M549 Dorsalgia, unspecified: Secondary | ICD-10-CM

## 2010-10-02 HISTORY — DX: Dorsalgia, unspecified: M54.9

## 2010-10-11 NOTE — Assessment & Plan Note (Signed)
Summary: PAIN IN BACK AND HIP/NWS  #   Vital Signs:  Patient profile:   65 year old female Height:      65.5 inches Weight:      191.75 pounds BMI:     31.54 O2 Sat:      96 % on Room air Temp:     97.7 degrees F oral Pulse rate:   65 / minute BP sitting:   110 / 60  (left arm) Cuff size:   regular  Vitals Entered By: Zella Ball Ewing CMA Duncan Dull) (October 02, 2010 3:44 PM)  O2 Flow:  Room air CC: Back and hip pain/RE   CC:  Back and hip pain/RE.  History of Present Illness: here to f/u with acute c/o pain to the right lower back x 5 days, mod to severe with flares that immobilize her due to severity,  worse to get up from sitting, radiates to right lateral hip area and groin,  sharp,  nothing makes better except for sitting adn lying,  no bowel or bladder changes, no LE distal pain, weakness or numbness, gait change or fall .  does need help to get up out of the car.  Has not tried OTC meds or other such as heating pad.  No prior hx of back pain similar, no prior MRI, surgical eval, PT or ESI treatments.     Pt denies CP, worsening sob, doe, wheezing, orthopnea, pnd, worsening LE edema, palps, dizziness or syncope  Pt denies new neuro symptoms such as headache, facial or extremity weakness  Pt denies polydipsia, polyuria.  Denies worsening depressive symptoms, suicidal ideation, or panic., though has ongoing anxiety.  Also has 4 wks ongoing right bicep area tenderness and discomfort , but only hurts to fully extend the arm, or try reach behind her back. No neck pain, radicular symtpoms, or RUE pain/weak/numb or less grip strength.    Problems Prior to Update: 1)  Arm Pain, Right  (ICD-729.5) 2)  Back Pain  (ICD-724.5) 3)  Sinusitis- Acute-nos  (ICD-461.9) 4)  Insomnia-sleep Disorder-unspec  (ICD-780.52) 5)  Anxiety  (ICD-300.00) 6)  Fatigue  (ICD-780.79) 7)  Menopausal Disorder  (ICD-627.9) 8)  Preventive Health Care  (ICD-V70.0) 9)  Hyperlipidemia  (ICD-272.4) 10)  Allergic  Rhinitis  (ICD-477.9) 11)  Abdominal Pain, Unspecified Site  (ICD-789.00) 12)  Acute Cystitis  (ICD-595.0)  Medications Prior to Update: 1)  Aspir-Low 81 Mg Tbec (Aspirin) .Marland Kitchen.. 1 By Mouth Once Daily 2)  Prometrium 100 Mg Caps (Progesterone Micronized) .Marland Kitchen.. 1 By Mouth Once Daily 3)  Nuvaring 0.12-0.015 Mg/24hr Ring (Etonogestrel-Ethinyl Estradiol) .... Use Asd 1 Q 3 Months 4)  Xyzal 5 Mg Tabs (Levocetirizine Dihydrochloride) .Marland Kitchen.. 1 By Mouth Once Daily 5)  Azithromycin 250 Mg Tabs (Azithromycin) .... 2po Qd For 1 Day, Then 1po Qd For 4days, Then Stop 6)  Tessalon Perles 100 Mg Caps (Benzonatate) .Marland Kitchen.. 1-2 By Mouth Three Times A Day As Needed  Current Medications (verified): 1)  Aspir-Low 81 Mg Tbec (Aspirin) .Marland Kitchen.. 1 By Mouth Once Daily 2)  Prometrium 100 Mg Caps (Progesterone Micronized) .Marland Kitchen.. 1 By Mouth Once Daily 3)  Nuvaring 0.12-0.015 Mg/24hr Ring (Etonogestrel-Ethinyl Estradiol) .... Use Asd 1 Q 3 Months 4)  Xyzal 5 Mg Tabs (Levocetirizine Dihydrochloride) .Marland Kitchen.. 1 By Mouth Once Daily 5)  Ultram Er 100 Mg Xr24h-Tab (Tramadol Hcl) .Marland Kitchen.. 1 - 2 By Mouth Once Daily As Needed For Pain 6)  Flexeril 5 Mg Tabs (Cyclobenzaprine Hcl) .Marland Kitchen.. 1 By Mouth Three Times A  Day As Needed Pain 7)  Prednisone 10 Mg Tabs (Prednisone) .... 3po Qd For 3days, Then 2po Qd For 3days, Then 1po Qd For 3days, Then Stop  Allergies (verified): 1)  ! * Tetanus  Past History:  Past Medical History: Last updated: 05/29/2010 Allergic rhinitis Hyperlipidemia c-spine DJD Anxiety  MD roster:  optho:  Dr Gwen Pounds                   GYN:  Dr Pennie Rushing                   Derm:  dr Joseph Art                   ortho:  Dr Darrelyn Hillock                   GI:  Dr Juanda Chance  Past Surgical History: Last updated: 03/24/2008 Denies surgical history  Social History: Last updated: 05/29/2010 Former Smoker Alcohol use-no 2 children Married retired Psychologist, occupational Drug use-no  Risk Factors: Smoking Status: quit (03/24/2008)  Review of Systems        all otherwise negative per pt -    Physical Exam  General:  alert and overweight-appearing Head:  normocephalic and atraumatic.   Eyes:  vision grossly intact, pupils equal, and pupils round.   Ears:  R ear normal and L ear normal.   Nose:  no external deformity and no nasal discharge.   Mouth:  no gingival abnormalities and pharynx pink and moist.   Neck:  supple and no masses.   Lungs:  normal respiratory effort and normal breath sounds.   Heart:  normal rate and regular rhythm.   Abdomen:  soft, non-tender, and normal bowel sounds.   Msk:  no joint tenderness and no joint swelling., and no spine or paraspinal tender, sweling , rash  ;  no pain on knee and hip ROM testing;  does have some tender to bicep insertion sites at the elbow without swelling or rash Extremities:  no edema, no erythema  Neurologic:  strength normal in all extremities, sensation intact to light touch, gait normal, and DTRs symmetrical and normal.except mild diminished right patellar Skin:  color normal and no rashes.   Psych:  not depressed appearing and slightly anxious.     Impression & Recommendations:  Problem # 1:  BACK PAIN (ICD-724.5)  Her updated medication list for this problem includes:    Aspir-low 81 Mg Tbec (Aspirin) .Marland Kitchen... 1 by mouth once daily    Ultram Er 100 Mg Xr24h-tab (Tramadol hcl) .Marland Kitchen... 1 - 2 by mouth once daily as needed for pain    Flexeril 5 Mg Tabs (Cyclobenzaprine hcl) .Marland Kitchen... 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 1 by mouth once daily 2)  Prometrium 100 Mg Caps (Progesterone micronized) .Marland KitchenMarland KitchenMarland Kitchen  1 by mouth once daily 3)  Nuvaring 0.12-0.015 Mg/24hr Ring (Etonogestrel-ethinyl estradiol) .... Use asd 1 q 3 months 4)  Xyzal 5 Mg Tabs (Levocetirizine dihydrochloride) .Marland Kitchen.. 1 by mouth once daily 5)  Ultram Er 100 Mg Xr24h-tab (Tramadol hcl) .Marland Kitchen.. 1 - 2 by mouth once daily as needed for pain 6)  Flexeril 5 Mg Tabs (Cyclobenzaprine hcl) .Marland Kitchen.. 1 by mouth three times a day as needed pain 7)  Prednisone 10 Mg Tabs (Prednisone) .... 3po qd for 3days, then 2po qd for 3days, then 1po qd for 3days, then stop  Patient Instructions: 1)  Please take all new medications as prescribed 2)  Continue all previous medications as before this visit 3)  Please schedule a follow-up appointment in 6 months or sooner if  needed Prescriptions: PREDNISONE 10 MG TABS (PREDNISONE) 3po qd for 3days, then 2po qd for 3days, then 1po qd for 3days, then stop  #18 x 0   Entered and Authorized by:   Corwin Levins MD   Signed by:   Corwin Levins MD on 10/02/2010   Method used:   Print then Give to Patient   RxID:   [id number] FLEXERIL 5 MG TABS (CYCLOBENZAPRINE HCL) 1 by mouth three times a day as needed pain  #90 x 0   Entered and Authorized by:   Corwin Levins MD   Signed by:   Corwin Levins MD on 10/02/2010   Method used:   Print then Give to Patient   RxID:   [id number] QIONGE ER 100 MG XR24H-TAB (TRAMADOL HCL) 1 - 2 by mouth once daily as needed for pain  #40 x 0   Entered and Authorized by:   Corwin Levins MD   Signed by:   Corwin Levins MD on 10/02/2010   Method used:   Print then Give to Patient   RxID:   [id number]    Orders Added: 1)  Est. Patient Level IV [53664]

## 2011-09-06 ENCOUNTER — Ambulatory Visit (INDEPENDENT_AMBULATORY_CARE_PROVIDER_SITE_OTHER): Payer: Medicare Other

## 2011-09-06 DIAGNOSIS — Z23 Encounter for immunization: Secondary | ICD-10-CM

## 2011-10-05 ENCOUNTER — Encounter: Payer: Self-pay | Admitting: Internal Medicine

## 2011-10-05 DIAGNOSIS — Z0000 Encounter for general adult medical examination without abnormal findings: Secondary | ICD-10-CM | POA: Insufficient documentation

## 2011-10-08 ENCOUNTER — Encounter: Payer: Self-pay | Admitting: Internal Medicine

## 2011-10-08 ENCOUNTER — Other Ambulatory Visit (INDEPENDENT_AMBULATORY_CARE_PROVIDER_SITE_OTHER): Payer: Medicare Other

## 2011-10-08 ENCOUNTER — Ambulatory Visit (INDEPENDENT_AMBULATORY_CARE_PROVIDER_SITE_OTHER): Payer: Medicare Other | Admitting: Internal Medicine

## 2011-10-08 VITALS — BP 110/70 | HR 71 | Temp 97.1°F | Ht 65.0 in | Wt 179.5 lb

## 2011-10-08 DIAGNOSIS — M47812 Spondylosis without myelopathy or radiculopathy, cervical region: Secondary | ICD-10-CM

## 2011-10-08 DIAGNOSIS — F411 Generalized anxiety disorder: Secondary | ICD-10-CM

## 2011-10-08 DIAGNOSIS — R5381 Other malaise: Secondary | ICD-10-CM

## 2011-10-08 DIAGNOSIS — E785 Hyperlipidemia, unspecified: Secondary | ICD-10-CM

## 2011-10-08 DIAGNOSIS — M25512 Pain in left shoulder: Secondary | ICD-10-CM

## 2011-10-08 DIAGNOSIS — R5383 Other fatigue: Secondary | ICD-10-CM

## 2011-10-08 DIAGNOSIS — M25519 Pain in unspecified shoulder: Secondary | ICD-10-CM

## 2011-10-08 HISTORY — DX: Spondylosis without myelopathy or radiculopathy, cervical region: M47.812

## 2011-10-08 HISTORY — DX: Pain in left shoulder: M25.512

## 2011-10-08 LAB — LIPID PANEL
Cholesterol: 196 mg/dL (ref 0–200)
HDL: 82 mg/dL (ref >39.00)
LDL Cholesterol: 106 mg/dL — ABNORMAL HIGH (ref 0–99)
Triglycerides: 39 mg/dL (ref 0.0–149.0)

## 2011-10-08 LAB — CBC WITH DIFFERENTIAL/PLATELET
Basophils Absolute: 0 10*3/uL (ref 0.0–0.1)
Basophils Relative: 0.6 % (ref 0.0–3.0)
Eosinophils Absolute: 0.1 10*3/uL (ref 0.0–0.7)
HCT: 35.5 % — ABNORMAL LOW (ref 36.0–46.0)
Hemoglobin: 11.9 g/dL — ABNORMAL LOW (ref 12.0–15.0)
Monocytes Relative: 7.8 % (ref 3.0–12.0)
Neutrophils Relative %: 59.8 % (ref 43.0–77.0)
Platelets: 127 10*3/uL — ABNORMAL LOW (ref 150.0–400.0)
WBC: 4.1 10*3/uL — ABNORMAL LOW (ref 4.5–10.5)

## 2011-10-08 LAB — HEPATIC FUNCTION PANEL
ALT: 15 U/L (ref 0–35)
AST: 20 U/L (ref 0–37)
Albumin: 3.9 g/dL (ref 3.5–5.2)
Alkaline Phosphatase: 67 U/L (ref 39–117)
Bilirubin, Direct: 0.1 mg/dL (ref 0.0–0.3)
Total Protein: 7.4 g/dL (ref 6.0–8.3)

## 2011-10-08 LAB — URINALYSIS, ROUTINE W REFLEX MICROSCOPIC
Ketones, ur: NEGATIVE
Leukocytes, UA: NEGATIVE
Nitrite: NEGATIVE
Total Protein, Urine: NEGATIVE
Urine Glucose: NEGATIVE
Urobilinogen, UA: 0.2 (ref 0.0–1.0)
pH: 6.5 (ref 5.0–8.0)

## 2011-10-08 LAB — TSH: TSH: 1.87 u[IU]/mL (ref 0.35–5.50)

## 2011-10-08 LAB — BASIC METABOLIC PANEL
BUN: 18 mg/dL (ref 6–23)
CO2: 31 mEq/L (ref 19–32)
Chloride: 103 mEq/L (ref 96–112)

## 2011-10-08 MED ORDER — ASPIRIN 81 MG PO TBEC: 81.0000 mg | DELAYED_RELEASE_TABLET | Freq: Every day | ORAL | Status: AC

## 2011-10-08 NOTE — Assessment & Plan Note (Addendum)
ECG reviewed as per emr, stable overall by hx and exam, most recent data reviewed with pt, and pt to continue medical treatment as before  Lab Results  Component Value Date   LDLCALC 122* 06/07/2009

## 2011-10-08 NOTE — Assessment & Plan Note (Signed)
stable overall by hx and exam, most recent data reviewed with pt, and pt to continue medical treatment as before  Lab Results  Component Value Date   WBC 3.6* 05/29/2010   HGB 12.1 05/29/2010   HCT 36.6 05/29/2010   PLT 144.0* 05/29/2010   GLUCOSE 87 05/29/2010   CHOL 205* 05/29/2010   TRIG 36.0 05/29/2010   HDL 80.80 05/29/2010   LDLDIRECT 116.3 05/29/2010   LDLCALC 122* 06/07/2009   ALT 16 05/29/2010   AST 25 05/29/2010   NA 143 05/29/2010   K 4.4 05/29/2010   CL 106 05/29/2010   CREATININE 0.7 05/29/2010   BUN 13 05/29/2010   CO2 31 05/29/2010   TSH 2.67 05/29/2010    

## 2011-10-08 NOTE — Assessment & Plan Note (Signed)
stable overall by hx and exam, most recent data reviewed with pt, and pt to continue medical treatment as before  Lab Results  Component Value Date   WBC 3.6* 05/29/2010   HGB 12.1 05/29/2010   HCT 36.6 05/29/2010   PLT 144.0* 05/29/2010   GLUCOSE 87 05/29/2010   CHOL 205* 05/29/2010   TRIG 36.0 05/29/2010   HDL 80.80 05/29/2010   LDLDIRECT 116.3 05/29/2010   LDLCALC 122* 06/07/2009   ALT 16 05/29/2010   AST 25 05/29/2010   NA 143 05/29/2010   K 4.4 05/29/2010   CL 106 05/29/2010   CREATININE 0.7 05/29/2010   BUN 13 05/29/2010   CO2 31 05/29/2010   TSH 2.67 05/29/2010

## 2011-10-08 NOTE — Patient Instructions (Addendum)
Take all new medications as prescribed   - Please start the Aspirin 81 mg (OTC)  - 1 per day - COATED only, to reduce risk of stroke and heart disease Please call if you would like the referral to orthopedic for the left shoulder Please go to LAB in the Basement for the blood and/or urine tests to be done today Please call the phone number [phone] (the [employer] System) for results of testing in 2-3 days;  When calling, simply dial the number, and when prompted enter the MRN number above (the Medical Record Number) and the # key, then the message should start. You are otherwise up to date with prevention today Please return in 1 year for your yearly visit, or sooner if needed

## 2011-10-08 NOTE — Progress Notes (Signed)
Subjective:    Patient ID: Alexis Maldonado, female    DOB: [birth year], 66 y.o.   MRN: [id number]  HPI  Here to f/u; c/o left shoulder pain x 9 mo, worse to forward elev and abduct but cannot recall any specific trauma, just started one day,  No neck or other arm pain.  Very hesitant to see ortho b/c afraid she might need surgury.  No pain or decr ROM to right shoulder.  Here to f/u; overall doing ok,  Pt denies chest pain, increased sob or doe, wheezing, orthopnea, PND, increased LE swelling, palpitations, dizziness or syncope.  Pt denies new neurological symptoms such as new headache, or facial or extremity weakness or numbness   Pt denies polydipsia, polyuria,  Pt states overall good compliance with meds, trying to follow lower cholesterol diet, wt overall stable but little exercise however.   Pt denies fever, wt loss, night sweats, loss of appetite, or other constitutional symptoms Overall good compliance with treatment, and good medicine tolerability.  Denies worsening depressive symptoms, suicidal ideation, or panic, though has ongoing anxiety, not increased recently.   Does have sense of ongoing fatigue, but denies signficant hypersomnolence. \ Past Medical History  Diagnosis Date  . HYPERLIPIDEMIA 03/24/2008  . ANXIETY 01/24/2010  . ALLERGIC RHINITIS 03/24/2008  . INSOMNIA-SLEEP DISORDER-UNSPEC 01/24/2010  . 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 No pertinent past surgical history.  reports that she has quit smoking. She does not have any smokeless tobacco history on file. She reports that she does not drink alcohol or use illicit drugs. family history is not on file. Allergies  Allergen Reactions  . Tetanus Toxoid     REACTION: sob and rash   No current outpatient prescriptions on file prior to visit.   Review of Systems Review of Systems  Constitutional: Negative for diaphoresis, activity change, appetite change and unexpected weight change.  HENT: Negative for  hearing loss, ear pain, facial swelling, mouth sores and neck stiffness.   Eyes: Negative for pain, redness and visual disturbance.  Respiratory: Negative for shortness of breath and wheezing.   Cardiovascular: Negative for chest pain and palpitations.  Gastrointestinal: Negative for diarrhea, blood in stool, abdominal distention and rectal pain.  Genitourinary: Negative for hematuria, flank pain and decreased urine volume.  Musculoskeletal: Negative for myalgias and joint swelling.  Skin: Negative for color change and wound.  Neurological: Negative for syncope and numbness.  Hematological: Negative for adenopathy.  Psychiatric/Behavioral: Negative for hallucinations, self-injury, decreased concentration and agitation.      Objective:   Physical Exam BP 110/70  Pulse 71  Temp(Src) 97.1 F (36.2 C) (Oral)  Ht 5\' 5"  (1.651 m)  Wt 179 lb 8 oz (81.421 kg)  BMI 29.87 kg/m2  SpO2 97% Physical Exam  VS noted Constitutional: Pt appears well-developed and well-nourished.  HENT: Head: Normocephalic.  Right Ear: External ear normal.  Left Ear: External ear normal.  Eyes: Conjunctivae and EOM are normal. Pupils are equal, round, and reactive to light.  Neck: Normal range of motion. Neck supple.  Cardiovascular: Normal rate and regular rhythm.   Pulmonary/Chest: Effort normal and breath sounds normal.  Abd:  Soft, NT, non-distended, + BS Neurological: Pt is alert. No cranial nerve deficit.  Skin: Skin is warm. No erythema.  Psychiatric: Pt behavior is normal. Thought content normal. 1+ nervous Left shoulder with minor tender, but has decr ROM to abduct/forw elev to 90 degrees at best, o/w neurovasc intact    Assessment & Plan:

## 2011-10-08 NOTE — Assessment & Plan Note (Signed)
I suspect left rotater cuff dz, delcines orthoat this time, will call if she changes her mind

## 2012-01-15 ENCOUNTER — Other Ambulatory Visit: Payer: Self-pay | Admitting: Obstetrics and Gynecology

## 2012-01-15 DIAGNOSIS — Z1231 Encounter for screening mammogram for malignant neoplasm of breast: Secondary | ICD-10-CM

## 2012-02-12 ENCOUNTER — Ambulatory Visit: Payer: Medicare Other

## 2012-02-12 ENCOUNTER — Ambulatory Visit
Admission: RE | Admit: 2012-02-12 | Discharge: 2012-02-12 | Disposition: A | Discharge status: Home / Self Care | Payer: Medicare Other | Source: Ambulatory Visit | Attending: Obstetrics and Gynecology | Admitting: Obstetrics and Gynecology

## 2012-02-12 DIAGNOSIS — Z1231 Encounter for screening mammogram for malignant neoplasm of breast: Secondary | ICD-10-CM

## 2012-02-17 ENCOUNTER — Other Ambulatory Visit: Payer: Self-pay | Admitting: Obstetrics and Gynecology

## 2012-02-17 DIAGNOSIS — R928 Other abnormal and inconclusive findings on diagnostic imaging of breast: Secondary | ICD-10-CM

## 2012-02-21 ENCOUNTER — Ambulatory Visit
Admission: RE | Admit: 2012-02-21 | Discharge: 2012-02-21 | Disposition: A | Discharge status: Home / Self Care | Payer: Medicare Other | Source: Ambulatory Visit | Attending: Obstetrics and Gynecology | Admitting: Obstetrics and Gynecology

## 2012-02-21 DIAGNOSIS — R928 Other abnormal and inconclusive findings on diagnostic imaging of breast: Secondary | ICD-10-CM

## 2012-05-20 ENCOUNTER — Telehealth: Payer: Self-pay | Admitting: Obstetrics and Gynecology

## 2012-05-20 DIAGNOSIS — Z780 Asymptomatic menopausal state: Secondary | ICD-10-CM

## 2012-05-20 NOTE — Telephone Encounter (Signed)
Tc to pt per telephone call. DEXA sched 07/02/12 @ 10:30 with vph. AEX prev sched 07/02/12 @ 9:30 with vph. Pt agrees.

## 2012-06-16 ENCOUNTER — Telehealth: Payer: Self-pay | Admitting: Obstetrics and Gynecology

## 2012-06-16 ENCOUNTER — Encounter: Payer: Self-pay | Admitting: Obstetrics and Gynecology

## 2012-06-16 ENCOUNTER — Ambulatory Visit (INDEPENDENT_AMBULATORY_CARE_PROVIDER_SITE_OTHER): Payer: Medicare Other | Admitting: Obstetrics and Gynecology

## 2012-06-16 VITALS — BP 112/60 | Temp 98.7°F | Ht 64.5 in | Wt 183.0 lb

## 2012-06-16 DIAGNOSIS — N390 Urinary tract infection, site not specified: Secondary | ICD-10-CM

## 2012-06-16 DIAGNOSIS — E559 Vitamin D deficiency, unspecified: Secondary | ICD-10-CM | POA: Insufficient documentation

## 2012-06-16 DIAGNOSIS — R3915 Urgency of urination: Secondary | ICD-10-CM | POA: Insufficient documentation

## 2012-06-16 DIAGNOSIS — N762 Acute vulvitis: Secondary | ICD-10-CM

## 2012-06-16 LAB — POCT URINALYSIS DIPSTICK
Bilirubin, UA: NEGATIVE
Glucose, UA: NEGATIVE
Ketones, UA: NEGATIVE
Leukocytes, UA: NEGATIVE
Nitrite, UA: NEGATIVE
Spec Grav, UA: 1.01
pH, UA: 5

## 2012-06-16 MED ORDER — RANITIDINE HCL 150 MG PO TABS: ORAL_TABLET | ORAL | Status: DC

## 2012-06-16 NOTE — Progress Notes (Signed)
GYN PROBLEM VISIT  Ms. JAHZARA SLATTERY is a 66 y.o. year old female,G2P2, who presents for a problem visit.   Subjective:  Pt is here for uti sx. C/o having dark yellow urine. Pt also c/o stomach discomfort x 2 months.  Occurs only at night and not every night.  Uses Estrace 2 times weekly.  Doesn't use Vesicare daily as prescribed.  Notes this discomfort not present if Vesicare is used daily.  Objective:  BP 112/60  Temp 98.7 F (37.1 C) (Oral)  Ht 5' 4.5" (1.638 m)  Wt 183 lb (83.008 kg)  BMI 30.93 kg/m2   GI: small nontender umbilical hernia.  No masses, tenderness to deep palpation or rebound  External genitalia: normal general appearance Vaginal: atrophic mucosa Cervix: atrophic, no lesions Adnexa: non palpable Uterus: normal single, nontender Rectal: good sphincter tone  Assessment:  Intermittent pelvic discomfort, r/o UTI  Plan: Return to office  07/02/12 for aex Urine C&S Continue Vesicare daily for a full month   Dierdre Forth, MD  06/16/2012 3:58 PM

## 2012-06-18 LAB — URINE CULTURE
Colony Count: NO GROWTH
Organism ID, Bacteria: NO GROWTH

## 2012-07-02 ENCOUNTER — Other Ambulatory Visit (INDEPENDENT_AMBULATORY_CARE_PROVIDER_SITE_OTHER): Payer: Medicare Other

## 2012-07-02 ENCOUNTER — Other Ambulatory Visit: Payer: Self-pay | Admitting: Obstetrics and Gynecology

## 2012-07-02 ENCOUNTER — Ambulatory Visit (INDEPENDENT_AMBULATORY_CARE_PROVIDER_SITE_OTHER): Payer: Medicare Other | Admitting: Obstetrics and Gynecology

## 2012-07-02 ENCOUNTER — Encounter: Payer: Self-pay | Admitting: Obstetrics and Gynecology

## 2012-07-02 VITALS — BP 100/62 | HR 72 | Resp 14 | Ht 65.0 in | Wt 183.0 lb

## 2012-07-02 DIAGNOSIS — Z13820 Encounter for screening for osteoporosis: Secondary | ICD-10-CM

## 2012-07-02 DIAGNOSIS — R3915 Urgency of urination: Secondary | ICD-10-CM

## 2012-07-02 DIAGNOSIS — N3281 Overactive bladder: Secondary | ICD-10-CM

## 2012-07-02 DIAGNOSIS — Z124 Encounter for screening for malignant neoplasm of cervix: Secondary | ICD-10-CM

## 2012-07-02 DIAGNOSIS — N951 Menopausal and female climacteric states: Secondary | ICD-10-CM

## 2012-07-02 DIAGNOSIS — N318 Other neuromuscular dysfunction of bladder: Secondary | ICD-10-CM

## 2012-07-02 DIAGNOSIS — Z01419 Encounter for gynecological examination (general) (routine) without abnormal findings: Secondary | ICD-10-CM

## 2012-07-02 DIAGNOSIS — Z780 Asymptomatic menopausal state: Secondary | ICD-10-CM

## 2012-07-02 DIAGNOSIS — Z719 Counseling, unspecified: Secondary | ICD-10-CM

## 2012-07-02 MED ORDER — SOLIFENACIN SUCCINATE 10 MG PO TABS: 10.0000 mg | ORAL_TABLET | Freq: Every day | ORAL | Status: DC

## 2012-07-02 NOTE — Progress Notes (Signed)
Annual:  Last Pap: 05/24/2009 WNL: Yes Regular Periods:no Contraception: BTL  Monthly Breast exam:no Tetanus<39yrs:no Nl.Bladder Function:yes W/Vesicare Daily BMs:yes Healthy Diet:yes Calcium:yes Mammogram:yes Date of Mammogram: 02/13/2012 Exercise:yes Have often Exercise: 2-3 times a week exercise class Seatbelt: yes Abuse at home: no Stressful work:no Sigmoid-colonoscopy: "2006 or 2007 per pt" Bone Density: No pt has scheduled Bone Scan today. PCP: Dr.James John Change in PMH: No Changes Change in FMH:No Changes  Subjective:    Alexis Maldonado is a 66 y.o. female G2P2 who presents for annual exam.  The patient has no complaints today.   The following portions of the patient's history were reviewed and updated as appropriate: allergies, current medications, past family history, past medical history, past social history, past surgical history and problem list.  Review of Systems Pertinent items are noted in HPI. Gastrointestinal:No change in bowel habits, no abdominal pain, no rectal bleeding Genitourinary:negative for dysuria, frequency, hematuria, nocturia and urinary incontinence    Objective:     BP 100/62  Resp 14  Ht 5\' 5"  (1.651 m)  Wt 183 lb (83.008 kg)  BMI 30.45 kg/m2  Weight:  Wt Readings from Last 1 Encounters:  07/02/12 183 lb (83.008 kg)     BMI: Body mass index is 30.45 kg/(m^2). General Appearance: Alert, appropriate appearance for age. No acute distress HEENT: Grossly normal Neck / Thyroid: Supple, no masses, nodes or enlargement Lungs: clear to auscultation bilaterally Back: No CVA tenderness Breast Exam: No masses or nodes.No dimpling, nipple retraction or discharge. Cardiovascular: Regular rate and rhythm. S1, S2, no murmur Gastrointestinal: Soft, non-tender, no masses or organomegaly Pelvic Exam: External genitalia: normal general appearance Vaginal: atrophic mucosa Cervix: atrophic Adnexa: non palpable Uterus: no enlargement noted Exam  limited by body habitus Rectovaginal: normal rectal, no masses Lymphatic Exam: Non-palpable nodes in neck, clavicular, axillary, or inguinal regions Skin: no rash or abnormalities Neurologic: Normal gait and speech, no tremor  Psychiatric: Alert and oriented, appropriate affect.    Urinalysis:Not done    Assessment:    Improved OAB and atrophic bladder sx    Plan:   mammogram pap smear with HR HPV return annually or prn DXA Use vaginal estrogen and vesicare prn    Dierdre Forth MD

## 2012-07-02 NOTE — Patient Instructions (Signed)
Overactive Bladder, Adult The bladder has two functions that are totally opposite of the other. One is to relax and stretch out so it can store urine (fills like a balloon), and the other is to contract and squeeze down so that it can empty the urine that it has stored. Proper functioning of the bladder is a complex mixing of these two functions. The filling and emptying of the bladder can be influenced by:  The bladder.  The spinal cord.  The brain.  The nerves going to the bladder.  Other organs that are closely related to the bladder such as prostate in males and the vagina in females. As your bladder fills with urine, nerve signals are sent from the bladder to the brain to tell you that you may need to urinate. Normal urination requires that the bladder squeeze down with sufficient strength to empty the bladder, but this also requires that the bladder squeeze down sufficiently long to finish the job. In addition the sphincter muscles, which normally keep you from leaking urine, must also relax so that the urine can pass. Coordination between the bladder muscle squeezing down and the sphincter muscles relaxing is required to make everything happen normally. With an overactive bladder sometimes the muscles of the bladder contract unexpectedly and involuntarily and this causes an urgent need to urinate. The normal response is to try to hold urine in by contracting the sphincter muscles. Sometimes the bladder contracts so strongly that the sphincter muscles cannot stop the urine from passing out and incontinence occurs. This kind of incontinence is called urge incontinence. Having an overactive bladder can be embarrassing and awkward. It can keep you from living life the way you want to. Many people think it is just something you have to put up with as you grow older or have certain health conditions. In fact, there are treatments that can help make your life easier and more pleasant. CAUSES  Many  things can cause an overactive bladder. Possibilities include:  Urinary tract infection or infection of nearby tissues such as the prostate.  Prostate enlargement.  In women, multiple pregnancies or surgery on the uterus or urethra.  Bladder stones, inflammation or tumors.  Caffeine.  Alcohol.  Medications. For example, diuretics (drugs that help the body get rid of extra fluid) increase urine production. Some other medicines must be taken with lots of fluids.  Muscle or nerve weakness. This might be the result of a spinal cord injury, a stroke, multiple sclerosis or Parkinson's disease.  Diabetes can cause a high urine volume which fills the bladder so quickly that the normal urge to urinate is triggered very strongly. SYMPTOMS   Loss of bladder control. You feel the need to urinate and cannot make your body wait.  Sudden, strong urges to urinate.  Urinating 8 or more times a day.  Waking up to urinate two or more times a night. DIAGNOSIS  To decide if you have overactive bladder, your healthcare provider will probably:  Ask about symptoms you have noticed.  Ask about your overall health. This will include questions about any medications you are taking.  Do a physical examination. This will help determine if there are obvious blockages or other problems.  Order some tests. These might include:  A blood test to check for diabetes or other health issues that could be contributing to the problem.  Urine testing. This could measure the flow of urine and the pressure on the bladder.  A test of your neurological   system (the brain, spinal cord and nerves). This is the system that senses the need to urinate. Some of these tests are called flow tests, bladder pressure tests and electrical measurements of the sphincter muscle.  A bladder test to check whether it is emptying completely when you urinate.  Cytoscopy. This test uses a thin tube with a tiny camera on it. It offers a  look inside your urethra and bladder to see if there are problems.  Imaging tests. You might be given a contrast dye and then asked to urinate. X-rays are taken to see how your bladder is working. TREATMENT  An overactive bladder can be treated in many ways. The treatment will depend on the cause. Whether you have a mild or severe case also makes a difference. Often, treatment can be given in your healthcare provider's office or clinic. Be sure to discuss the different options with your caregiver. They include:  Behavioral treatments. These do not involve medication or surgery:  Bladder training. For this, you would follow a schedule to urinate at regular intervals. This helps you learn to control the urge to urinate. At first, you might be asked to wait a few minutes after feeling the urge. In time, you should be able to schedule bathroom visits an hour or more apart.  Kegel exercises. These exercises strengthen the pelvic floor muscles, which support the bladder. By toning these muscles, they can help control urination, even if the bladder muscles are overactive. A specialist will teach you how to do these exercises correctly. They will require daily practice.  Weight loss. If you are obese or overweight, losing weight might stop your bladder from being overactive. Talk to your healthcare provider about how many pounds you should lose. Also ask if there is a specific program or method that would work best for you.  Diet change. This might be suggested if constipation is making your overactive bladder worse. Your healthcare provider or a nutritionist can explain ways to change what you eat to ease constipation. Other people might need to take in less caffeine or alcohol. Sometimes drinking fewer fluids is needed, too.  Protection. This is not an actual treatment. But, you could wear special pads to take care of any leakage while you wait for other treatments to take effect. This will help you avoid  embarrassment.  Physical treatments.  Electrical stimulation. Electrodes will send gentle pulses to the nerves or muscles that help control the bladder. The goal is to strengthen them. Sometimes this is done with the electrodes outside of the body. Or, they might be placed inside the body (implanted). This treatment can take several months to have an effect.  Medications. These are usually used along with other treatments. Several medicines are available. Some are injected into the muscles involved in urination. Others come in pill form. Medications sometimes prescribed include:  Anticholinergics. These drugs block the signals that the nerves deliver to the bladder. This keeps it from releasing urine at the wrong time. Researchers think the drugs might help in other ways, too.  Imipramine. This is an antidepressant. But, it relaxes bladder muscles.  Botox. This is still experimental. Some people believe that injecting it into the bladder muscles will relax them so they work more normally. It has also been injected into the sphincter muscle when the sphincter muscle does not open properly. This is a temporary fix, however. Also, it might make matters worse, especially in older people.  Surgery.  A device might be implanted   to help manage your nerves. It works on the nerves that signal when you need to urinate.  Surgery is sometimes needed with electrical stimulation. If the electrodes are implanted, this is done through surgery.  Sometimes repairs need to be made through surgery. For example, the size of the bladder can be changed. This is usually done in severe cases only. HOME CARE INSTRUCTIONS   Take any medications your healthcare provider prescribed or suggested. Follow the directions carefully.  Practice any lifestyle changes that are recommended. These might include:  Drinking less fluid or drinking at different times of the day. If you need to urinate often during the night, for  example, you may need to stop drinking fluids early in the evening.  Cutting down on caffeine or alcohol. They can both make an overactive bladder worse. Caffeine is found in coffee, tea and sodas.  Doing Kegel exercises to strengthen muscles.  Losing weight, if that is recommended.  Eating a healthy and balanced diet. This will help you avoid constipation.  Keep a journal or a log. You might be asked to record how much you drink and when, and also when you feel the need to urinate.  Learn how to care for implants or other devices, such as pessaries. SEEK MEDICAL CARE IF:   Your overactive bladder gets worse.  You feel increased pain or irritation when you urinate.  You notice blood in your urine.  You have questions about any medications or devices that your healthcare provider recommended.  You notice blood, pus or swelling at the site of any test or treatment procedure.  You have an oral temperature above 102 F (38.9 C). SEEK IMMEDIATE MEDICAL CARE IF:  You have an oral temperature above 102 F (38.9 C), not controlled by medicine. Document Released: 06/08/2009 Document Revised: 11/04/2011 Document Reviewed: 06/08/2009 [employer] Patient Information 2013 [employer].  

## 2012-07-06 LAB — PAP IG AND HPV HIGH-RISK

## 2012-09-16 ENCOUNTER — Other Ambulatory Visit: Payer: Self-pay | Admitting: Obstetrics and Gynecology

## 2012-09-16 DIAGNOSIS — R921 Mammographic calcification found on diagnostic imaging of breast: Secondary | ICD-10-CM

## 2012-09-24 ENCOUNTER — Ambulatory Visit (INDEPENDENT_AMBULATORY_CARE_PROVIDER_SITE_OTHER): Payer: 59

## 2012-09-24 DIAGNOSIS — Z23 Encounter for immunization: Secondary | ICD-10-CM

## 2012-09-30 ENCOUNTER — Encounter: Payer: Self-pay | Admitting: Obstetrics and Gynecology

## 2012-09-30 ENCOUNTER — Ambulatory Visit
Admission: RE | Admit: 2012-09-30 | Discharge: 2012-09-30 | Disposition: A | Discharge status: Home / Self Care | Payer: 59 | Source: Ambulatory Visit | Attending: Obstetrics and Gynecology | Admitting: Obstetrics and Gynecology

## 2012-09-30 DIAGNOSIS — R921 Mammographic calcification found on diagnostic imaging of breast: Secondary | ICD-10-CM | POA: Insufficient documentation

## 2012-09-30 DIAGNOSIS — R922 Inconclusive mammogram: Secondary | ICD-10-CM | POA: Insufficient documentation

## 2013-02-18 ENCOUNTER — Other Ambulatory Visit: Payer: Self-pay | Admitting: Obstetrics and Gynecology

## 2013-02-18 DIAGNOSIS — R921 Mammographic calcification found on diagnostic imaging of breast: Secondary | ICD-10-CM

## 2013-03-15 ENCOUNTER — Ambulatory Visit
Admission: RE | Admit: 2013-03-15 | Discharge: 2013-03-15 | Disposition: A | Discharge status: Home / Self Care | Payer: Medicare Other | Source: Ambulatory Visit | Attending: Obstetrics and Gynecology | Admitting: Obstetrics and Gynecology

## 2013-03-15 DIAGNOSIS — R921 Mammographic calcification found on diagnostic imaging of breast: Secondary | ICD-10-CM

## 2013-08-31 ENCOUNTER — Other Ambulatory Visit: Payer: Self-pay | Admitting: Obstetrics and Gynecology

## 2013-08-31 DIAGNOSIS — R921 Mammographic calcification found on diagnostic imaging of breast: Secondary | ICD-10-CM

## 2013-09-15 ENCOUNTER — Ambulatory Visit
Admission: RE | Admit: 2013-09-15 | Discharge: 2013-09-15 | Disposition: A | Discharge status: Home / Self Care | Payer: Medicare Other | Source: Ambulatory Visit | Attending: Obstetrics and Gynecology | Admitting: Obstetrics and Gynecology

## 2013-09-15 DIAGNOSIS — R921 Mammographic calcification found on diagnostic imaging of breast: Secondary | ICD-10-CM

## 2013-09-15 LAB — HM MAMMOGRAPHY

## 2013-10-13 ENCOUNTER — Ambulatory Visit (INDEPENDENT_AMBULATORY_CARE_PROVIDER_SITE_OTHER): Payer: Medicare Other

## 2013-10-13 ENCOUNTER — Ambulatory Visit (INDEPENDENT_AMBULATORY_CARE_PROVIDER_SITE_OTHER): Payer: Medicare Other | Admitting: Internal Medicine

## 2013-10-13 ENCOUNTER — Encounter: Payer: Self-pay | Admitting: Internal Medicine

## 2013-10-13 VITALS — BP 120/80 | HR 69 | Temp 98.3°F | Ht 65.5 in | Wt 190.0 lb

## 2013-10-13 DIAGNOSIS — J309 Allergic rhinitis, unspecified: Secondary | ICD-10-CM

## 2013-10-13 DIAGNOSIS — E785 Hyperlipidemia, unspecified: Secondary | ICD-10-CM

## 2013-10-13 DIAGNOSIS — M545 Low back pain, unspecified: Secondary | ICD-10-CM | POA: Insufficient documentation

## 2013-10-13 DIAGNOSIS — F411 Generalized anxiety disorder: Secondary | ICD-10-CM

## 2013-10-13 DIAGNOSIS — Z23 Encounter for immunization: Secondary | ICD-10-CM

## 2013-10-13 DIAGNOSIS — Z136 Encounter for screening for cardiovascular disorders: Secondary | ICD-10-CM

## 2013-10-13 HISTORY — DX: Low back pain, unspecified: M54.50

## 2013-10-13 LAB — CBC WITH DIFFERENTIAL/PLATELET
BASOS ABS: 0 10*3/uL (ref 0.0–0.1)
Basophils Relative: 0.5 % (ref 0.0–3.0)
EOS PCT: 1.7 % (ref 0.0–5.0)
Eosinophils Absolute: 0.1 10*3/uL (ref 0.0–0.7)
HEMATOCRIT: 36.4 % (ref 36.0–46.0)
HEMOGLOBIN: 11.8 g/dL — AB (ref 12.0–15.0)
Lymphocytes Relative: 38 % (ref 12.0–46.0)
Lymphs Abs: 1.6 10*3/uL (ref 0.7–4.0)
MCHC: 32.5 g/dL (ref 30.0–36.0)
MCV: 97.1 fl (ref 78.0–100.0)
MONOS PCT: 8.5 % (ref 3.0–12.0)
Monocytes Absolute: 0.4 10*3/uL (ref 0.1–1.0)
NEUTROS ABS: 2.2 10*3/uL (ref 1.4–7.7)
Neutrophils Relative %: 51.3 % (ref 43.0–77.0)
Platelets: 138 10*3/uL — ABNORMAL LOW (ref 150.0–400.0)
RBC: 3.74 Mil/uL — ABNORMAL LOW (ref 3.87–5.11)
RDW: 14.1 % (ref 11.5–14.6)
WBC: 4.3 10*3/uL — ABNORMAL LOW (ref 4.5–10.5)

## 2013-10-13 LAB — URINALYSIS, ROUTINE W REFLEX MICROSCOPIC
Bilirubin Urine: NEGATIVE
KETONES UR: NEGATIVE
Nitrite: NEGATIVE
PH: 5.5 (ref 5.0–8.0)
Specific Gravity, Urine: >=1.03 — AB (ref 1.000–1.030)
Total Protein, Urine: NEGATIVE
URINE GLUCOSE: NEGATIVE
Urobilinogen, UA: 0.2 (ref 0.0–1.0)

## 2013-10-13 LAB — HEPATIC FUNCTION PANEL
ALBUMIN: 3.9 g/dL (ref 3.5–5.2)
ALT: 20 U/L (ref 0–35)
AST: 23 U/L (ref 0–37)
Alkaline Phosphatase: 76 U/L (ref 39–117)
BILIRUBIN DIRECT: 0 mg/dL (ref 0.0–0.3)
Total Bilirubin: 0.5 mg/dL (ref 0.3–1.2)
Total Protein: 7.5 g/dL (ref 6.0–8.3)

## 2013-10-13 LAB — LIPID PANEL
CHOLESTEROL: 201 mg/dL — AB (ref 0–200)
HDL: 68.4 mg/dL (ref >39.00)
Total CHOL/HDL Ratio: 3
Triglycerides: 55 mg/dL (ref 0.0–149.0)
VLDL: 11 mg/dL (ref 0.0–40.0)

## 2013-10-13 LAB — BASIC METABOLIC PANEL
BUN: 19 mg/dL (ref 6–23)
CHLORIDE: 105 meq/L (ref 96–112)
CO2: 29 meq/L (ref 19–32)
CREATININE: 0.6 mg/dL (ref 0.4–1.2)
Calcium: 9.2 mg/dL (ref 8.4–10.5)
GFR: 117.97 mL/min (ref >60.00)
Glucose, Bld: 79 mg/dL (ref 70–99)
Potassium: 3.9 mEq/L (ref 3.5–5.1)
Sodium: 139 mEq/L (ref 135–145)

## 2013-10-13 MED ORDER — FLUTICASONE PROPIONATE 50 MCG/ACT NA SUSP: 2.0000 | Freq: Every day | NASAL | Status: AC

## 2013-10-13 MED ORDER — FLUTICASONE PROPIONATE 50 MCG/ACT NA SUSP: 2.0000 | Freq: Every day | NASAL | Status: DC

## 2013-10-13 NOTE — Progress Notes (Signed)
Subjective:    Patient ID: Alexis Maldonado, female    DOB: [birth year], 68 y.o.   MRN: [id number]  HPI  Here for yearly f/u;  Overall doing ok;  Pt denies CP, worsening SOB, DOE, wheezing, orthopnea, PND, worsening LE edema, palpitations, dizziness or syncope.  Pt denies neurological change such as new headache, facial or extremity weakness.  Pt denies polydipsia, polyuria, or low sugar symptoms. Pt states overall good compliance with treatment and medications, good tolerability, and has been trying to follow lower cholesterol diet.  Pt denies worsening depressive symptoms, suicidal ideation or panic. No fever, night sweats, wt loss, loss of appetite, or other constitutional symptoms.  Pt states good ability with ADL's, has low fall risk, home safety reviewed and adequate, no other significant changes in hearing or vision. Does have several wks ongoing nasal allergy symptoms with clearish congestion, itch and sneezing, without fever, pain, ST, cough, swelling or wheezing. Pt continues to have recurring LBP without change in severity, bowel or bladder change, fever, wt loss,  worsening LE pain/numbness/weakness, gait change or falls. Past Medical History  Diagnosis Date  . HYPERLIPIDEMIA 03/24/2008  . ANXIETY 01/24/2010  . ALLERGIC RHINITIS 03/24/2008  . INSOMNIA-SLEEP DISORDER-UNSPEC 01/24/2010  . 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 Urinary urgency   . Vulvitis   . Vitamin D deficiency    Past Surgical History  Procedure Laterality Date  . Tubal ligation      reports that she has quit smoking. She has never used smokeless tobacco. She reports that she does not drink alcohol or use illicit drugs. family history includes Cancer in her mother and sister. Allergies  Allergen Reactions  . Tetanus Toxoid     REACTION: sob and rash   Current Outpatient Prescriptions on File Prior to Visit  Medication Sig Dispense Refill  . Calcium Carbonate (CALCIUM 500 PO) Take by mouth.      . estradiol  (ESTRACE) 0.1 MG/GM vaginal cream Place 2 g vaginally daily.      . solifenacin (VESICARE) 10 MG tablet Take 1 tablet (10 mg total) by mouth daily.  30 tablet  12   No current facility-administered medications on file prior to visit.   Review of Systems  Constitutional: Negative for unexpected weight change, or unusual diaphoresis  HENT: Negative for tinnitus.   Eyes: Negative for photophobia and visual disturbance.  Respiratory: Negative for choking and stridor.   Gastrointestinal: Negative for vomiting and blood in stool.  Genitourinary: Negative for hematuria and decreased urine volume.  Musculoskeletal: Negative for acute joint swelling Skin: Negative for color change and wound.  Neurological: Negative for tremors and numbness other than noted  Psychiatric/Behavioral: Negative for decreased concentration or  hyperactivity.       Objective:   Physical Exam BP 120/80  Pulse 69  Temp(Src) 98.3 F (36.8 C) (Oral)  Ht 5' 5.5" (1.664 m)  Wt 190 lb (86.183 kg)  BMI 31.13 kg/m2  SpO2 94% VS noted, not ill appaering Constitutional: Pt appears well-developed and well-nourished.  HENT: Head: NCAT.  Right Ear: External ear normal.  Left Ear: External ear normal.  Eyes: Conjunctivae and EOM are normal. Pupils are equal, round, and reactive to light.  Bilat tm's with mild erythema.  Max sinus areas non tender.  Pharynx with mild erythema, no exudate Neck: Normal range of motion. Neck supple.  Cardiovascular: Normal rate and regular rhythm.   Pulmonary/Chest: Effort normal and breath sounds normal.  Abd:  Soft, NT, non-distended, + BS Neurological:  Pt is alert. Not confused , spine nontender, motor 5/5 Skin: Skin is warm. No erythema.  Psychiatric: Pt behavior is normal. Thought content normal. mild nervous, ? Mild dysphoric    Assessment & Plan:

## 2013-10-13 NOTE — Assessment & Plan Note (Signed)
For flonase asd,  to f/u any worsening symptoms or concerns  

## 2013-10-13 NOTE — Assessment & Plan Note (Addendum)
ECG reviewed as per emr, stable overall by history and exam, recent data reviewed with pt, and pt to continue medical treatment as before,  to f/u any worsening symptoms or concerns Lab Results  Component Value Date   LDLCALC 106* 10/08/2011   For f/u lab

## 2013-10-13 NOTE — Assessment & Plan Note (Signed)
C/w prob underlying lower lumbar djd or ddd, for tylenol prn

## 2013-10-13 NOTE — Progress Notes (Signed)
Pre-visit discussion using our clinic review tool. No additional management support is needed unless otherwise documented below in the visit note.  

## 2013-10-13 NOTE — Assessment & Plan Note (Signed)
stable overall by history and exam, recent data reviewed with pt, and pt to continue medical treatment as before,  to f/u any worsening symptoms or concerns Lab Results  Component Value Date   WBC 4.1* 10/08/2011   HGB 11.9* 10/08/2011   HCT 35.5* 10/08/2011   PLT 127.0* 10/08/2011   GLUCOSE 85 10/08/2011   CHOL 196 10/08/2011   TRIG 39.0 10/08/2011   HDL 82.00 10/08/2011   LDLDIRECT 116.3 05/29/2010   LDLCALC 106* 10/08/2011   ALT 15 10/08/2011   AST 20 10/08/2011   NA 141 10/08/2011   K 4.3 10/08/2011   CL 103 10/08/2011   CREATININE 0.7 10/08/2011   BUN 18 10/08/2011   CO2 31 10/08/2011   TSH 1.87 10/08/2011

## 2013-10-13 NOTE — Patient Instructions (Signed)
You had the new Prevnar pneumonia shot today Please take all new medication as prescribed- the flonase Please continue all other medications as before, including the zyrtec, and tylenol for the back pain  Please continue your efforts at being more active, low cholesterol diet, and weight control. You are otherwise up to date with prevention measures today.  Please go to the LAB in the Basement (turn left off the elevator) for the tests to be done today You will be contacted by phone if any changes need to be made immediately.  Otherwise, you will receive a letter about your results with an explanation, but please check with MyChart first.  Please return in 1 year for your yearly visit, or sooner if needed

## 2013-10-14 ENCOUNTER — Emergency Department (HOSPITAL_COMMUNITY)
Admission: EM | Admit: 2013-10-14 | Discharge: 2013-10-14 | Disposition: A | Discharge status: Home / Self Care | Payer: Medicare Other | Attending: Emergency Medicine | Admitting: Emergency Medicine

## 2013-10-14 ENCOUNTER — Encounter: Payer: Self-pay | Admitting: Internal Medicine

## 2013-10-14 ENCOUNTER — Telehealth: Payer: Self-pay | Admitting: *Deleted

## 2013-10-14 ENCOUNTER — Encounter (HOSPITAL_COMMUNITY): Payer: Self-pay | Admitting: Emergency Medicine

## 2013-10-14 ENCOUNTER — Emergency Department (HOSPITAL_COMMUNITY): Payer: Medicare Other

## 2013-10-14 DIAGNOSIS — Z8739 Personal history of other diseases of the musculoskeletal system and connective tissue: Secondary | ICD-10-CM | POA: Insufficient documentation

## 2013-10-14 DIAGNOSIS — Z79899 Other long term (current) drug therapy: Secondary | ICD-10-CM | POA: Insufficient documentation

## 2013-10-14 DIAGNOSIS — Z8742 Personal history of other diseases of the female genital tract: Secondary | ICD-10-CM | POA: Insufficient documentation

## 2013-10-14 DIAGNOSIS — Z8639 Personal history of other endocrine, nutritional and metabolic disease: Secondary | ICD-10-CM | POA: Insufficient documentation

## 2013-10-14 DIAGNOSIS — Y849 Medical procedure, unspecified as the cause of abnormal reaction of the patient, or of later complication, without mention of misadventure at the time of the procedure: Secondary | ICD-10-CM | POA: Insufficient documentation

## 2013-10-14 DIAGNOSIS — Z87448 Personal history of other diseases of urinary system: Secondary | ICD-10-CM | POA: Insufficient documentation

## 2013-10-14 DIAGNOSIS — Z8709 Personal history of other diseases of the respiratory system: Secondary | ICD-10-CM | POA: Insufficient documentation

## 2013-10-14 DIAGNOSIS — R6883 Chills (without fever): Secondary | ICD-10-CM | POA: Insufficient documentation

## 2013-10-14 DIAGNOSIS — T8062XA Other serum reaction due to vaccination, initial encounter: Secondary | ICD-10-CM | POA: Insufficient documentation

## 2013-10-14 DIAGNOSIS — Z862 Personal history of diseases of the blood and blood-forming organs and certain disorders involving the immune mechanism: Secondary | ICD-10-CM | POA: Insufficient documentation

## 2013-10-14 DIAGNOSIS — IMO0002 Reserved for concepts with insufficient information to code with codable children: Secondary | ICD-10-CM | POA: Insufficient documentation

## 2013-10-14 DIAGNOSIS — T7840XA Allergy, unspecified, initial encounter: Secondary | ICD-10-CM

## 2013-10-14 DIAGNOSIS — Z87891 Personal history of nicotine dependence: Secondary | ICD-10-CM | POA: Insufficient documentation

## 2013-10-14 LAB — LDL CHOLESTEROL, DIRECT: LDL DIRECT: 115.2 mg/dL

## 2013-10-14 LAB — TSH: TSH: 2.01 u[IU]/mL (ref 0.35–5.50)

## 2013-10-14 MED ORDER — DIPHENHYDRAMINE HCL 25 MG PO CAPS
25.0000 mg | ORAL_CAPSULE | Freq: Once | ORAL | Status: AC
  Administered 2013-10-14: 25 mg via ORAL
  Filled 2013-10-14: qty 1

## 2013-10-14 NOTE — ED Provider Notes (Signed)
CSN: [id number]     Arrival date & time 10/14/13  0973 History   First MD Initiated Contact with Patient 10/14/13 0309     Chief Complaint  Patient presents with  . Allergic Reaction     (Consider location/radiation/quality/duration/timing/severity/associated sxs/prior Treatment) Patient is a 68 y.o. female presenting with allergic reaction. The history is provided by the patient.  Allergic Reaction Presenting symptoms: no itching, no rash, no swelling and no wheezing   Severity:  Mild Prior allergic episodes:  No prior episodes Context: medications   Context comment:  Pneumonia vaccination Relieved by:  Nothing Worsened by:  Nothing tried Ineffective treatments:  None tried   Past Medical History  Diagnosis Date  . HYPERLIPIDEMIA 03/24/2008  . ANXIETY 01/24/2010  . ALLERGIC RHINITIS 03/24/2008  . INSOMNIA-SLEEP DISORDER-UNSPEC 01/24/2010  . 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 Urinary urgency   . Vulvitis   . Vitamin D deficiency    Past Surgical History  Procedure Laterality Date  . Tubal ligation     Family History  Problem Relation Age of Onset  . Cancer Mother     Breast   . Cancer Sister    History  Substance Use Topics  . Smoking status: Former Research scientist (life sciences)  . Smokeless tobacco: Never Used  . Alcohol Use: No   OB History   Grav Para Term Preterm Abortions TAB SAB Ect Mult Living   2 2        2      Review of Systems  Constitutional: Positive for chills. Negative for fever.  Respiratory: Negative for cough and wheezing.   Cardiovascular: Negative for chest pain, palpitations and leg swelling.  Skin: Negative for itching and rash.  Psychiatric/Behavioral: Positive for agitation.      Allergies  Tetanus toxoid  Home Medications   Current Outpatient Rx  Name  Route  Sig  Dispense  Refill  . Calcium Carbonate (CALCIUM 500 PO)   Oral   Take by mouth.         . estradiol (ESTRACE) 0.1 MG/GM vaginal cream   Vaginal   Place 2 g vaginally daily.          . fluticasone (FLONASE) 50 MCG/ACT nasal spray   Each Nare   Place 2 sprays into both nostrils daily.   16 g   2   . solifenacin (VESICARE) 10 MG tablet   Oral   Take 1 tablet (10 mg total) by mouth daily.   30 tablet   12    BP 125/85  Pulse 98  Temp(Src) 98.2 F (36.8 C) (Oral)  Resp 18  Wt 192 lb 14.4 oz (87.499 kg)  SpO2 95% Physical Exam  Constitutional: She is oriented to person, place, and time. She appears well-developed and well-nourished. No distress.  HENT:  Head: Normocephalic and atraumatic.  Mouth/Throat: Oropharynx is clear and moist.  No swelling of the lips tongue of uvula  Eyes: Conjunctivae are normal. Pupils are equal, round, and reactive to light.  Neck: Normal range of motion. Neck supple.  Cardiovascular: Normal rate, regular rhythm and intact distal pulses.   Pulmonary/Chest: Effort normal and breath sounds normal. No stridor. No respiratory distress. She has no wheezes. She has no rales.  Abdominal: Soft. Bowel sounds are normal. There is no tenderness. There is no rebound and no guarding.  Musculoskeletal: Normal range of motion. She exhibits no edema and no tenderness.  Neurological: She is alert and oriented to person, place, and time.  Skin: Skin is warm  and dry.  Psychiatric: She has a normal mood and affect.    ED Course  Procedures (including critical care time) Labs Review Labs Reviewed - No data to display Imaging Review No results found.  EKG Interpretation   None       MDM   Final diagnoses:  None    Airway clear, benadryl as needed for symptoms follow up with your family doctor for ongoing care return for swelling of your lips or tongue    Luiscarlos Kaczmarczyk K Shevy Yaney-Rasch, MD 10/14/13 0422

## 2013-10-14 NOTE — Discharge Instructions (Signed)
Allergies °Allergies may happen from anything your body is sensitive to. This may be food, medicines, pollens, chemicals, and nearly anything around you in everyday life that produces allergens. An allergen is anything that causes an allergy producing substance. Heredity is often a factor in causing these problems. This means you may have some of the same allergies as your parents. °Food allergies happen in all age groups. Food allergies are some of the most severe and life threatening. Some common food allergies are cow's milk, seafood, eggs, nuts, wheat, and soybeans. °SYMPTOMS  °· Swelling around the mouth. °· An itchy red rash or hives. °· Vomiting or diarrhea. °· Difficulty breathing. °SEVERE ALLERGIC REACTIONS ARE LIFE-THREATENING. °This reaction is called anaphylaxis. It can cause the mouth and throat to swell and cause difficulty with breathing and swallowing. In severe reactions only a trace amount of food (for example, peanut oil in a salad) may cause death within seconds. °Seasonal allergies occur in all age groups. These are seasonal because they usually occur during the same season every year. They may be a reaction to molds, grass pollens, or tree pollens. Other causes of problems are house dust mite allergens, pet dander, and mold spores. The symptoms often consist of nasal congestion, a runny itchy nose associated with sneezing, and tearing itchy eyes. There is often an associated itching of the mouth and ears. The problems happen when you come in contact with pollens and other allergens. Allergens are the particles in the air that the body reacts to with an allergic reaction. This causes you to release allergic antibodies. Through a chain of events, these eventually cause you to release histamine into the blood stream. Although it is meant to be protective to the body, it is this release that causes your discomfort. This is why you were given anti-histamines to feel better.  If you are unable to  pinpoint the offending allergen, it may be determined by skin or blood testing. Allergies cannot be cured but can be controlled with medicine. °Hay fever is a collection of all or some of the seasonal allergy problems. It may often be treated with simple over-the-counter medicine such as diphenhydramine. Take medicine as directed. Do not drink alcohol or drive while taking this medicine. Check with your caregiver or package insert for child dosages. °If these medicines are not effective, there are many new medicines your caregiver can prescribe. Stronger medicine such as nasal spray, eye drops, and corticosteroids may be used if the first things you try do not work well. Other treatments such as immunotherapy or desensitizing injections can be used if all else fails. Follow up with your caregiver if problems continue. These seasonal allergies are usually not life threatening. They are generally more of a nuisance that can often be handled using medicine. °HOME CARE INSTRUCTIONS  °· If unsure what causes a reaction, keep a diary of foods eaten and symptoms that follow. Avoid foods that cause reactions. °· If hives or rash are present: °· Take medicine as directed. °· You may use an over-the-counter antihistamine (diphenhydramine) for hives and itching as needed. °· Apply cold compresses (cloths) to the skin or take baths in cool water. Avoid hot baths or showers. Heat will make a rash and itching worse. °· If you are severely allergic: °· Following a treatment for a severe reaction, hospitalization is often required for closer follow-up. °· Wear a medic-alert bracelet or necklace stating the allergy. °· You and your family must learn how to give adrenaline or use   an anaphylaxis kit. °· If you have had a severe reaction, always carry your anaphylaxis kit or EpiPen® with you. Use this medicine as directed by your caregiver if a severe reaction is occurring. Failure to do so could have a fatal outcome. °SEEK MEDICAL  CARE IF: °· You suspect a food allergy. Symptoms generally happen within 30 minutes of eating a food. °· Your symptoms have not gone away within 2 days or are getting worse. °· You develop new symptoms. °· You want to retest yourself or your child with a food or drink you think causes an allergic reaction. Never do this if an anaphylactic reaction to that food or drink has happened before. Only do this under the care of a caregiver. °SEEK IMMEDIATE MEDICAL CARE IF:  °· You have difficulty breathing, are wheezing, or have a tight feeling in your chest or throat. °· You have a swollen mouth, or you have hives, swelling, or itching all over your body. °· You have had a severe reaction that has responded to your anaphylaxis kit or an EpiPen®. These reactions may return when the medicine has worn off. These reactions should be considered life threatening. °MAKE SURE YOU:  °· Understand these instructions. °· Will watch your condition. °· Will get help right away if you are not doing well or get worse. °Document Released: 11/05/2002 Document Revised: 12/07/2012 Document Reviewed: 04/11/2008 °[employer]® Patient Information ©2014 [employer]. ° °

## 2013-10-14 NOTE — ED Notes (Signed)
Patient stated she got a Pneumonia shot this afternoon about 430pm and felt fine.  She felt fine and went to bed around 10pm.  She woke up about 0045 feeling SOB, chills, agitated.

## 2013-10-14 NOTE — Telephone Encounter (Signed)
[facility] Triage Call Report Triage Record Num: [id number] Operator: Doug Sou Patient Name: Alexis Maldonado Call Date & Time: 10/14/2013 1:45:45AM Patient Phone: [phone] PCP: Cathlean Cower Patient Gender: Female PCP Fax : [phone] Patient DOB: 24-Sep-1945 Practice Name: [facility] Reason for Call: Caller: [name]/Patient; PCP: Cathlean Cower (Adults only); CB#: [phone]; Call regarding states she received the pneumonia vaccine on 10/13/13 and started having chills and agitation on 10/14/13 at 1:20 am. Afebrile per caller. States she feels like she is having breathing problems. States she feels like her breathing is labored. States she is allergic to Tetanus Toxoid and states she is having the same type of symptoms. Triaged per Allergic Reaction, Severe guideline. To activate EMS 911 due to severe breathing problems. Care advice given. Advised caller to call 911 and caller states she does not want to call 911 she states she will have her husband take her to [facility] ED. Protocol(s) Used: Allergic Reaction, Severe Recommended Outcome per Protocol: Activate EMS 911 Reason for Outcome: Severe breathing problems Care Advice: ~ Do not give the patient anything to eat or drink. ~ IMMEDIATE ACTION Write down provider's name. List or place the following in a bag for transport with the patient: current prescription and/or nonprescription medications; alternative treatments, therapies and medications; and street drugs. ~ 02/

## 2013-10-14 NOTE — ED Notes (Addendum)
Pt started having chills and shaking around 0030, stated had PNA  Vaccination around 1630. Stated some slight SOB after her chills and shaking started. Has received PNA Vaccination 5 yrs ago, denies having this reaction. Stated had reaction Tetanus five yrs ago also.  Denies any reaction with FlU shot.

## 2014-02-28 ENCOUNTER — Other Ambulatory Visit: Payer: Self-pay | Admitting: Obstetrics and Gynecology

## 2014-02-28 DIAGNOSIS — R921 Mammographic calcification found on diagnostic imaging of breast: Secondary | ICD-10-CM

## 2014-03-16 ENCOUNTER — Ambulatory Visit
Admission: RE | Admit: 2014-03-16 | Discharge: 2014-03-16 | Disposition: A | Discharge status: Home / Self Care | Payer: Medicare Other | Source: Ambulatory Visit | Attending: Obstetrics and Gynecology | Admitting: Obstetrics and Gynecology

## 2014-03-16 DIAGNOSIS — R921 Mammographic calcification found on diagnostic imaging of breast: Secondary | ICD-10-CM

## 2014-06-27 ENCOUNTER — Encounter (HOSPITAL_COMMUNITY): Payer: Self-pay | Admitting: Emergency Medicine

## 2014-10-27 ENCOUNTER — Encounter: Payer: Self-pay | Admitting: Internal Medicine

## 2015-03-23 ENCOUNTER — Other Ambulatory Visit: Payer: Self-pay

## 2015-03-23 DIAGNOSIS — Z1231 Encounter for screening mammogram for malignant neoplasm of breast: Secondary | ICD-10-CM

## 2015-05-02 ENCOUNTER — Ambulatory Visit
Admission: RE | Admit: 2015-05-02 | Discharge: 2015-05-02 | Disposition: A | Discharge status: Home / Self Care | Payer: Medicare Other | Source: Ambulatory Visit

## 2015-05-02 DIAGNOSIS — Z1231 Encounter for screening mammogram for malignant neoplasm of breast: Secondary | ICD-10-CM

## 2015-09-21 ENCOUNTER — Other Ambulatory Visit: Payer: Self-pay

## 2015-09-21 ENCOUNTER — Ambulatory Visit (INDEPENDENT_AMBULATORY_CARE_PROVIDER_SITE_OTHER): Payer: Medicare Other | Admitting: Gastroenterology

## 2015-09-21 ENCOUNTER — Encounter: Payer: Self-pay | Admitting: Gastroenterology

## 2015-09-21 VITALS — BP 110/70 | HR 64 | Ht 65.0 in | Wt 190.0 lb

## 2015-09-21 DIAGNOSIS — K5900 Constipation, unspecified: Secondary | ICD-10-CM

## 2015-09-21 DIAGNOSIS — K625 Hemorrhage of anus and rectum: Secondary | ICD-10-CM

## 2015-09-21 DIAGNOSIS — Z1211 Encounter for screening for malignant neoplasm of colon: Secondary | ICD-10-CM | POA: Diagnosis not present

## 2015-09-21 MED ORDER — NA SULFATE-K SULFATE-MG SULF 17.5-3.13-1.6 GM/177ML PO SOLN: ORAL | Status: DC

## 2015-09-21 NOTE — Patient Instructions (Signed)
You have been scheduled for a colonoscopy. Please follow written instructions given to you at your visit today.  Please pick up your prep supplies at the pharmacy within the next 1-3 days. If you use inhalers (even only as needed), please bring them with you on the day of your procedure. Your physician has requested that you go to www.startemmi.com and enter the access code given to you at your visit today. This web site gives a general overview about your procedure. However, you should still follow specific instructions given to you by our office regarding your preparation for the procedure.  We have sent the following medications to your pharmacy for you to pick up at your convenience: Miralax  

## 2015-09-21 NOTE — Progress Notes (Signed)
HPI :  70 y/o female seen in consultation for blood in the stools and changes in bowel habits.   Patirnt reports last Friday she had a strong urge to have a bowel movement but had significant straining and had a hard time getting out stool. She reported she eventually passed hard stool balls and then passed a very large bowel movement and had some blood in the toilet water after passing the stool. She had some lower abdominal discomfort and this resolved after passing this large movement. She had some soreness in her perianal area at the time. She took a laxative the next day and had another bowel movement with blood on the stool itself, about a teaspoon in quantity. She states she has had bowel movements since that time without blood, but continues to have constipation. She had been taking a stool softener and passing stools but remains irregular and not daily. She had no blood in the last few bowel movements. She denies any straining or pain in the anal area at this time. She has some mild discomfort in her lower abdomen. She thinks her appetite is poor since this happened. She normally has regular bowel habits prior to this episode. She had a negative FOBT in 2016. She normally does not have blood in the stools. No colon cancer in the family.   Last colonoscopy in 2007 was normal   Past Medical History  Diagnosis Date  . HYPERLIPIDEMIA 03/24/2008  . ANXIETY 01/24/2010  . ALLERGIC RHINITIS 03/24/2008  . INSOMNIA-SLEEP DISORDER-UNSPEC 01/24/2010  . 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 Urinary urgency   . Vulvitis   . Vitamin D deficiency      Past Surgical History  Procedure Laterality Date  . Tubal ligation     Family History  Problem Relation Age of Onset  . Cancer Mother     Breast   . Cancer Sister    Social History  Substance Use Topics  . Smoking status: Former Research scientist (life sciences)  . Smokeless tobacco: Never Used  . Alcohol Use: No   Current Outpatient Prescriptions  Medication Sig  Dispense Refill  . Calcium Carbonate (CALCIUM 500 PO) Take by mouth.    . docusate sodium (COLACE) 100 MG capsule Take 100 mg by mouth daily.    Marland Kitchen estradiol (ESTRACE) 0.1 MG/GM vaginal cream Place 2 g vaginally daily.    . fluticasone (FLONASE) 50 MCG/ACT nasal spray Place 2 sprays into both nostrils daily. 16 g 2  . solifenacin (VESICARE) 10 MG tablet Take 1 tablet (10 mg total) by mouth daily. 30 tablet 12   No current facility-administered medications for this visit.   Allergies  Allergen Reactions  . Prevnar [Pneumococcal 13-Val Conj Vacc]   . Tetanus Toxoid     REACTION: sob and rash     Review of Systems: All systems reviewed and negative except where noted in HPI.    Lab Results  Component Value Date   WBC 4.3* 10/13/2013   HGB 11.8* 10/13/2013   HCT 36.4 10/13/2013   MCV 97.1 10/13/2013   PLT 138.0* 10/13/2013   Lab Results  Component Value Date   CREATININE 0.6 10/13/2013   BUN 19 10/13/2013   NA 139 10/13/2013   K 3.9 10/13/2013   CL 105 10/13/2013   CO2 29 10/13/2013   Lab Results  Component Value Date   ALT 20 10/13/2013   AST 23 10/13/2013   ALKPHOS 76 10/13/2013   BILITOT 0.5 10/13/2013     Physical  Exam: BP 110/70 mmHg  Pulse 64  Ht 5\' 5"  (1.651 m)  Wt 190 lb (86.183 kg)  BMI 31.62 kg/m2 Constitutional: Pleasant,well-developed, female in no acute distress. HEENT: Normocephalic and atraumatic. Conjunctivae are normal. No scleral icterus. Neck supple.  Cardiovascular: Normal rate, regular rhythm.  Pulmonary/chest: Effort normal and breath sounds normal. No wheezing, rales or rhonchi. Abdominal: Soft, nondistended, nontender. Bowel sounds active throughout. There are no masses palpable. No hepatomegaly. Extremities: trace edema B Lymphadenopathy: No cervical adenopathy noted. Neurological: Alert and oriented to person place and time. Skin: Skin is warm and dry. No rashes noted. Psychiatric: Normal mood and affect. Behavior is  normal.   ASSESSMENT AND PLAN: 70 y/o female with PMH as outlined above, who developed acute rectal bleeding in the setting of constipation. She has had a normal colonoscopy in 2007. She had 2 episodes of rectal bleeding, which has since resolved, however she continues to be constipated. I discussed the differential for rectal bleeding with her. I suspect she most likely had hemorrhoidal bleeding or anal fissure in light of passing hard stool. Recommend daily miralax at this time, and titrate up the dose as needed to produce one soft bowel movement per day. Otherwise, she is due for CRC screening and recommended a colonoscopy for her in this light to ensure no bleeding polyp or mass lesion, although reassured her I think this is unlikely. The indications, risks, and benefits of colonoscopy were explained to the patient in detail. Risks include but are not limited to bleeding, perforation, adverse reaction to medications, and cardiopulmonary compromise. Sequelae include but are not limited to the possibility of surgery, hospitalization, and mortality. The patient verbalized understanding and wished to proceed. All questions answered, referred to the scheduler and bowel prep ordered. Rectal exam deferred today will perform at colonsocopy given her acute bleeding symptoms have resolved. Further recommendations pending results of the exam.   Bancroft Cellar, MD [facility] Pager [phone]  CC: Biagio Borg, MD

## 2015-09-26 ENCOUNTER — Encounter: Payer: Self-pay | Admitting: Gastroenterology

## 2015-09-26 ENCOUNTER — Ambulatory Visit (AMBULATORY_SURGERY_CENTER): Payer: Medicare Other | Admitting: Gastroenterology

## 2015-09-26 VITALS — BP 123/76 | HR 60 | Temp 97.6°F | Resp 18 | Ht 65.0 in | Wt 190.0 lb

## 2015-09-26 DIAGNOSIS — K625 Hemorrhage of anus and rectum: Secondary | ICD-10-CM | POA: Diagnosis not present

## 2015-09-26 MED ORDER — SODIUM CHLORIDE 0.9 % IV SOLN: 500.0000 mL | INTRAVENOUS | Status: DC

## 2015-09-26 NOTE — Op Note (Signed)
[facility]  [address] [place], [address]   COLONOSCOPY PROCEDURE REPORT  PATIENT: Alexis Maldonado, Alexis Maldonado  MR#: [id number] BIRTHDATE: 1946/07/25 , 69  yrs. old GENDER: female ENDOSCOPIST: Yetta Flock, MD REFERRED BY: Leanna Battles MD PROCEDURE DATE:  09/26/2015 PROCEDURE:   Colonoscopy, diagnostic First Screening Colonoscopy - Avg.  risk and is 50 yrs.  old or older - No.  Prior Negative Screening - Now for repeat screening. 10 or more years since last screening  History of Adenoma - Now for follow-up colonoscopy & has been > or = to 3 yrs.  N/A  Recommend repeat exam, <10 yrs? No ASA CLASS:   Class II INDICATIONS:Evaluation of unexplained GI bleeding and Colorectal Neoplasm Risk Assessment for this procedure is average risk. MEDICATIONS: Propofol 200 mg IV  DESCRIPTION OF PROCEDURE:   After the risks benefits and alternatives of the procedure were thoroughly explained, informed consent was obtained.  The digital rectal exam revealed no abnormalities of the rectum.   The LB [id number]  endoscope was introduced through the anus and advanced to the terminal ileum which was intubated for a short distance. No adverse events experienced.   The quality of the prep was adequate  The instrument was then slowly withdrawn as the colon was fully examined. Estimated blood loss is zero unless otherwise noted in this procedure report.   COLON FINDINGS: The examined colonic mucosa was normal.  No polyps or mass lesions noted.  No inflammatory changes appreciated.  The terminal ileum was normal.  Retroflexed views revealed internal hemorrhoids. The time to cecum = 3.1 Withdrawal time = 9.5   The scope was withdrawn and the procedure completed. COMPLICATIONS: There were no immediate complications.  ENDOSCOPIC IMPRESSION: Normal colon Normal ileum Internal hemorrhoids, which are the likely cause of previous symptoms of rectal  bleeding  RECOMMENDATIONS: Resume diet Resume medications Continue miralax as needed for constipation Repeat colon cancer screening in 10 years if you wish to have further screening at that time  eSigned:  Yetta Flock, MD 09/26/2015 8:22 AM   cc:  Leanna Battles MD, the patient

## 2015-09-26 NOTE — Patient Instructions (Signed)
RESUME YOUR MEDICATIONS AND DIET.  CONTINUE YOUR MIRALAX AS NEEDED FOR CONSTIPATION.     YOU HAD AN ENDOSCOPIC PROCEDURE TODAY AT [facility]:   Refer to the procedure report that was given to you for any specific questions about what was found during the examination.  If the procedure report does not answer your questions, please call your gastroenterologist to clarify.  If you requested that your care partner not be given the details of your procedure findings, then the procedure report has been included in a sealed envelope for you to review at your convenience later.  YOU SHOULD EXPECT: Some feelings of bloating in the abdomen. Passage of more gas than usual.  Walking can help get rid of the air that was put into your GI tract during the procedure and reduce the bloating. If you had a lower endoscopy (such as a colonoscopy or flexible sigmoidoscopy) you may notice spotting of blood in your stool or on the toilet paper. If you underwent a bowel prep for your procedure, you may not have a normal bowel movement for a few days.  Please Note:  You might notice some irritation and congestion in your nose or some drainage.  This is from the oxygen used during your procedure.  There is no need for concern and it should clear up in a day or so.  SYMPTOMS TO REPORT IMMEDIATELY:   Following lower endoscopy (colonoscopy or flexible sigmoidoscopy):  Excessive amounts of blood in the stool  Significant tenderness or worsening of abdominal pains  Swelling of the abdomen that is new, acute  Fever of 100F or higher   For urgent or emergent issues, a gastroenterologist can be reached at any hour by calling [phone].   DIET: Your first meal following the procedure should be a small meal and then it is ok to progress to your normal diet. Heavy or fried foods are harder to digest and may make you feel nauseous or bloated.  Likewise, meals heavy in dairy and vegetables can increase  bloating.  Drink plenty of fluids but you should avoid alcoholic beverages for 24 hours.  ACTIVITY:  You should plan to take it easy for the rest of today and you should NOT DRIVE or use heavy machinery until tomorrow (because of the sedation medicines used during the test).    FOLLOW UP: Our staff will call the number listed on your records the next business day following your procedure to check on you and address any questions or concerns that you may have regarding the information given to you following your procedure. If we do not reach you, we will leave a message.  However, if you are feeling well and you are not experiencing any problems, there is no need to return our call.  We will assume that you have returned to your regular daily activities without incident.  If any biopsies were taken you will be contacted by phone or by letter within the next 1-3 weeks.  Please call us at [phone] if you have not heard about the biopsies in 3 weeks.    SIGNATURES/CONFIDENTIALITY: You and/or your care partner have signed paperwork which will be entered into your electronic medical record.  These signatures attest to the fact that that the information above on your After Visit Summary has been reviewed and is understood.  Full responsibility of the confidentiality of this discharge information lies with you and/or your care-partner.

## 2015-09-26 NOTE — Progress Notes (Signed)
To pacu pt awake and alert vss report to rn

## 2015-09-27 ENCOUNTER — Telehealth: Payer: Self-pay

## 2015-09-27 NOTE — Telephone Encounter (Signed)
Left message on answering machine. 

## 2015-12-14 ENCOUNTER — Encounter: Payer: Self-pay | Admitting: Vascular Surgery

## 2015-12-14 ENCOUNTER — Other Ambulatory Visit: Payer: Self-pay | Admitting: *Deleted

## 2015-12-14 DIAGNOSIS — I872 Venous insufficiency (chronic) (peripheral): Secondary | ICD-10-CM

## 2016-03-14 ENCOUNTER — Encounter: Payer: Self-pay | Admitting: Vascular Surgery

## 2016-03-19 ENCOUNTER — Encounter: Payer: Self-pay | Admitting: Vascular Surgery

## 2016-03-19 ENCOUNTER — Ambulatory Visit (HOSPITAL_COMMUNITY)
Admission: RE | Admit: 2016-03-19 | Discharge: 2016-03-19 | Disposition: A | Discharge status: Home / Self Care | Payer: Medicare Other | Source: Ambulatory Visit | Attending: Vascular Surgery | Admitting: Vascular Surgery

## 2016-03-19 ENCOUNTER — Ambulatory Visit (INDEPENDENT_AMBULATORY_CARE_PROVIDER_SITE_OTHER): Payer: Medicare Other | Admitting: Vascular Surgery

## 2016-03-19 VITALS — BP 125/76 | HR 65 | Temp 98.0°F | Resp 18 | Ht 65.5 in | Wt 189.7 lb

## 2016-03-19 DIAGNOSIS — M7989 Other specified soft tissue disorders: Secondary | ICD-10-CM | POA: Insufficient documentation

## 2016-03-19 DIAGNOSIS — I872 Venous insufficiency (chronic) (peripheral): Secondary | ICD-10-CM | POA: Diagnosis not present

## 2016-03-19 DIAGNOSIS — M79604 Pain in right leg: Secondary | ICD-10-CM

## 2016-03-19 NOTE — Progress Notes (Signed)
[facility]  Patient name: Alexis Maldonado MRN: [id number] DOB: July 25, 1946 Sex: female  REASON FOR CONSULT: Right leg discomfort and bilateral leg swelling  HPI: Alexis Maldonado is a 70 y.o. female, who presents with 1 year history of right leg discomfort and bilateral lower extremity swelling. Patient states that she has a "strain" sensation to the lateral right calf with driving. She does not notice this discomfort with walking. She does note bilateral lower extremity swelling that is worse at the end of the day. She has the least first thing in the morning. The patient is retired, but runs errands all day. He exercises regularly and says that her mobility is better when she exercises. She denies any hip or calf claudication. She has no history of DVT. She believes that her mother had varicose veins. She has worn compression stockings a decade ago, but stopped because they were uncomfortable.  She is a former smoker quitting 50 years ago.  Past Medical History:  Diagnosis Date  . ALLERGIC RHINITIS 03/24/2008  . ANXIETY 01/24/2010  . 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 Chronic pain of right knee   . HYPERLIPIDEMIA 03/24/2008  . INSOMNIA-SLEEP DISORDER-UNSPEC 01/24/2010  . Osteoarthritis   . Pain and swelling of lower leg    bilateral lower extremities  . Urinary urgency   . Venous insufficiency   . Vitamin D deficiency   . Vulvitis     Family History  Problem Relation Age of Onset  . Cancer Mother     Breast   . Cancer Sister     SOCIAL HISTORY: Social History   Social History  . Marital status: Married    Spouse name: N/A  . Number of children: N/A  . Years of education: N/A   Occupational History  . Not on file.   Social History Main Topics  . Smoking status: Former Smoker    Types: Cigarettes  . Smokeless tobacco: Never Used  . Alcohol use No  . Drug use: No  . Sexual activity: Yes    Partners: Male    Birth control/  protection: Post-menopausal, None   Other Topics Concern  . Not on file   Social History Narrative  . No narrative on file    Allergies  Allergen Reactions  . Prevnar [Pneumococcal 13-Val Conj Vacc]   . Tetanus Toxoid     REACTION: sob and rash    Current Outpatient Prescriptions  Medication Sig Dispense Refill  . aspirin 81 MG tablet Take 81 mg by mouth daily.    . Calcium Carbonate (CALCIUM 500 PO) Take by mouth.    . docusate sodium (COLACE) 100 MG capsule Take 100 mg by mouth daily.    Marland Kitchen estradiol (ESTRACE) 0.1 MG/GM vaginal cream Place 2 g vaginally daily.    . fluticasone (FLONASE) 50 MCG/ACT nasal spray Place 2 sprays into both nostrils daily. 16 g 2  . ipratropium (ATROVENT) 0.03 % nasal spray Place 2 sprays into both nostrils 3 (three) times daily.    . meloxicam (MOBIC) 15 MG tablet Take 15 mg by mouth daily.    . solifenacin (VESICARE) 10 MG tablet Take 1 tablet (10 mg total) by mouth daily. 30 tablet 12  . cyclobenzaprine (FLEXERIL) 10 MG tablet Take 10 mg by mouth at bedtime.     No current facility-administered medications for this visit.     REVIEW OF SYSTEMS:  [X]  denotes positive finding, [ ]  denotes negative finding Cardiac  Comments:  Chest pain or chest pressure:    Shortness of breath upon exertion:    Short of breath when lying flat:    Irregular heart rhythm:        Vascular    Pain in calf, thigh, or hip brought on by ambulation:    Pain in feet at night that wakes you up from your sleep:     Blood clot in your veins:    Leg swelling:  x       Pulmonary    Oxygen at home:    Productive cough:     Wheezing:         Neurologic    Sudden weakness in arms or legs:  x Right leg  Sudden numbness in arms or legs:     Sudden onset of difficulty speaking or slurred speech:    Temporary loss of vision in one eye:     Problems with dizziness:         Gastrointestinal    Blood in stool:     Vomited blood:         Genitourinary    Burning when  urinating:     Blood in urine:        Psychiatric    Major depression:         Hematologic    Bleeding problems:    Problems with blood clotting too easily:        Skin    Rashes or ulcers:        Constitutional    Fever or chills:      PHYSICAL EXAM: Vitals:   03/19/16 1253  BP: 125/76  Pulse: 65  Resp: 18  Temp: 98 F (36.7 C)  TempSrc: Oral  SpO2: 99%  Weight: 189 lb 11.2 oz (86 kg)  Height: 5' 5.5" (1.664 m)    GENERAL: The patient is a well-nourished female, in no acute distress. The vital signs are documented above. CARDIAC: There is a regular rate and rhythm. No carotid bruits. VASCULAR: 2+ radial and 2+ her Salas pedis pulses bilaterally. PULMONARY: There is good air exchange bilaterally without wheezing or rales. ABDOMEN: Soft and non-tender with normal pitched bowel sounds.  MUSCULOSKELETAL: 1+ swelling lower extremities bilaterally. Some spider veins of the legs bilaterally. NEUROLOGIC: No focal weakness or paresthesias are detected. SKIN: There are no ulcers or rashes noted. PSYCHIATRIC: The patient has a normal affect.  DATA:  Lower extremity venous reflux evaluation 03/19/2016  No evidence of DVT bilaterally. There is common femoral reflux and popliteal reflux bilaterally. There is mild great saphenous and small saphenous reflux bilaterally.  MEDICAL ISSUES: Right leg pain  The patient's right lateral shin pain occurs mainly with driving. She states that this feels like a strain. Feel like this is likely musculoskeletal in nature. The patient has palpable pedal pulses bilaterally. Do not believe this is related to arterial issues.  Bilateral lower extremity swelling  The patient does have mild chronic venous insufficiency bilaterally. Do not recommend any venous procedures. Advised conservative treatment with compression stockings and elevation.  The patient will follow up on an as-needed basis.   Virgina Jock, PA-C [facility]  I have examined the patient, reviewed and agree with above.Discussed her normal arterial exam and mild venous insufficiency. She was reassured with this discussion will see [place] again on as-needed basis

## 2016-07-22 ENCOUNTER — Other Ambulatory Visit: Payer: Self-pay | Admitting: Obstetrics and Gynecology

## 2016-07-22 DIAGNOSIS — Z1231 Encounter for screening mammogram for malignant neoplasm of breast: Secondary | ICD-10-CM

## 2016-09-03 ENCOUNTER — Ambulatory Visit: Payer: Medicare Other

## 2017-11-26 ENCOUNTER — Other Ambulatory Visit (HOSPITAL_COMMUNITY): Payer: Self-pay | Admitting: Obstetrics and Gynecology

## 2017-11-26 DIAGNOSIS — N950 Postmenopausal bleeding: Secondary | ICD-10-CM

## 2017-11-28 ENCOUNTER — Ambulatory Visit (HOSPITAL_COMMUNITY): Payer: Medicare Other

## 2017-12-03 ENCOUNTER — Other Ambulatory Visit (HOSPITAL_COMMUNITY): Payer: Self-pay | Admitting: Obstetrics and Gynecology

## 2017-12-03 ENCOUNTER — Ambulatory Visit (HOSPITAL_COMMUNITY)
Admission: RE | Admit: 2017-12-03 | Discharge: 2017-12-03 | Disposition: A | Discharge status: Home / Self Care | Payer: Medicare Other | Source: Ambulatory Visit | Attending: Obstetrics and Gynecology | Admitting: Obstetrics and Gynecology

## 2017-12-03 DIAGNOSIS — N950 Postmenopausal bleeding: Secondary | ICD-10-CM | POA: Insufficient documentation

## 2017-12-03 DIAGNOSIS — N800 Endometriosis of uterus: Secondary | ICD-10-CM | POA: Diagnosis not present

## 2017-12-03 DIAGNOSIS — D259 Leiomyoma of uterus, unspecified: Secondary | ICD-10-CM | POA: Diagnosis not present

## 2018-04-09 ENCOUNTER — Other Ambulatory Visit: Payer: Self-pay | Admitting: Obstetrics and Gynecology

## 2018-04-09 DIAGNOSIS — Z1231 Encounter for screening mammogram for malignant neoplasm of breast: Secondary | ICD-10-CM

## 2018-05-06 ENCOUNTER — Inpatient Hospital Stay: Admission: RE | Admit: 2018-05-06 | Payer: Medicare Other | Source: Ambulatory Visit

## 2018-06-09 ENCOUNTER — Ambulatory Visit
Admission: RE | Admit: 2018-06-09 | Discharge: 2018-06-09 | Disposition: A | Discharge status: Home / Self Care | Payer: Medicare Other | Source: Ambulatory Visit | Attending: Obstetrics and Gynecology | Admitting: Obstetrics and Gynecology

## 2018-06-09 DIAGNOSIS — Z1231 Encounter for screening mammogram for malignant neoplasm of breast: Secondary | ICD-10-CM

## 2019-07-20 ENCOUNTER — Other Ambulatory Visit: Payer: Self-pay

## 2019-07-20 DIAGNOSIS — Z20822 Contact with and (suspected) exposure to covid-19: Secondary | ICD-10-CM

## 2019-07-21 LAB — NOVEL CORONAVIRUS, NAA: SARS-CoV-2, NAA: NOT DETECTED

## 2019-07-24 ENCOUNTER — Telehealth: Payer: Self-pay

## 2019-07-24 NOTE — Telephone Encounter (Signed)
Patient called in requesting [facility] lab results - DOB/Address verified - Negative results given, reset MyChart password, no further questions.

## 2019-08-05 ENCOUNTER — Ambulatory Visit: Payer: Medicare Other | Admitting: Cardiovascular Disease

## 2019-08-05 ENCOUNTER — Other Ambulatory Visit: Payer: Self-pay

## 2019-08-05 ENCOUNTER — Encounter: Payer: Self-pay | Admitting: Cardiology

## 2019-08-05 VITALS — BP 132/68 | HR 72 | Ht 65.5 in | Wt 184.0 lb

## 2019-08-05 DIAGNOSIS — R5383 Other fatigue: Secondary | ICD-10-CM

## 2019-08-05 DIAGNOSIS — I872 Venous insufficiency (chronic) (peripheral): Secondary | ICD-10-CM

## 2019-08-05 DIAGNOSIS — R0600 Dyspnea, unspecified: Secondary | ICD-10-CM

## 2019-08-05 DIAGNOSIS — E785 Hyperlipidemia, unspecified: Secondary | ICD-10-CM | POA: Diagnosis not present

## 2019-08-05 NOTE — Patient Instructions (Addendum)
Medication Instructions:   *If you need a refill on your cardiac medications before your next appointment, please call your pharmacy*  Lab Work:  If you have labs (blood work) drawn today and your tests are completely normal, you will receive your results only by: Marland Kitchen MyChart Message (if you have MyChart) OR . A paper copy in the mail If you have any lab test that is abnormal or we need to change your treatment, we will call you to review the results.  Testing/Procedures: Your physician has requested that you have an echocardiogram. Echocardiography is a painless test that uses sound waves to create images of your heart. It provides your doctor with information about the size and shape of your heart and how well your heart's chambers and valves are working. This procedure takes approximately one hour. There are no restrictions for this procedure.  Follow-Up: At [facility], you and your health needs are our priority.  As part of our continuing mission to provide you with exceptional heart care, we have created designated Provider Care Teams.  These Care Teams include your primary Cardiologist (physician) and Advanced Practice Providers (APPs -  Physician Assistants and Nurse Practitioners) who all work together to provide you with the care you need, when you need it.  Your next appointment:   As needed   The format for your next appointment:   In Person  Provider:   You may see Dr. Johnsie Cancel or one of the following Advanced Practice Providers on your designated Care Team:    Truitt Merle, NP  Cecilie Kicks, NP  Kathyrn Drown, NP

## 2019-08-05 NOTE — Progress Notes (Signed)
CARDIOLOGY CONSULT NOTE       Patient ID: JAHIYA GLEICH MRN: [id number] DOB/AGE: September 20, 1945 73 y.o.  Admit date: (Not on file) Referring Physician: Philip Aspen his [place] NP  Primary Physician: Leanna Battles, MD Primary Cardiologist: New Reason for Consultation: Dypsnea  Active Problems:   * No active hospital problems. *   HPI:  73 y.o. referred by Dr Philip Aspen for dyspnea Former smoker quit 50 years ago. She has chronic LE edema with no history of DVT seen by Dr Early for venous reflux disease. CRF;s only include HLD No documented vascular disease He recommended compression stockings and elevation only   She has had non specific fatigue and dyspnea last few weeks Had 24 hours of mid sternal tightness Wednesday/Thursday Some intermittent fluttering in chest She is mostly fatigued during day and then feels like she breaths too fast when she lays down at night   COVID negative test last week at [facility]   Married lives with husband no smoking/ETOH Retired with two older boys   Labs reviewed Hct 38 ferritin normal TSH 2.8 K 4.6   ROS All other systems reviewed and negative except as noted above  Past Medical History:  Diagnosis Date  . ALLERGIC RHINITIS 03/24/2008  . ANXIETY 01/24/2010  . BACK PAIN 10/02/2010   Qualifier: Diagnosis of  By: Jenny Reichmann MD, Hunt Oris   . 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 Chronic pain of right knee   . HYPERLIPIDEMIA 03/24/2008  . INSOMNIA-SLEEP DISORDER-UNSPEC 01/24/2010  . Left shoulder pain 10/08/2011  . Lower back pain 10/13/2013  . MENOPAUSAL DISORDER 03/24/2008   Qualifier: Diagnosis of  By: Jenny Reichmann MD, Hunt Oris   . Osteoarthritis   . Pain and swelling of lower leg    bilateral lower extremities  . SINUSITIS- ACUTE-NOS 07/27/2010   Qualifier: Diagnosis of  By: Jenny Reichmann MD, Hunt Oris   . Urinary urgency   . Venous insufficiency   . Vitamin D deficiency   . Vulvitis     Family History  Problem Relation Age of Onset  . Cancer Mother    Breast   . Breast cancer Mother   . Cancer Sister     Social History   Socioeconomic History  . Marital status: Married    Spouse name: Not on file  . Number of children: Not on file  . Years of education: Not on file  . Highest education level: Not on file  Occupational History  . Not on file  Tobacco Use  . Smoking status: Former Smoker    Types: Cigarettes  . Smokeless tobacco: Never Used  Substance and Sexual Activity  . Alcohol use: No  . Drug use: No  . Sexual activity: Yes    Partners: Male    Birth control/protection: Post-menopausal, None  Other Topics Concern  . Not on file  Social History Narrative  . Not on file   Social Determinants of Health   Financial Resource Strain:   . Difficulty of Paying Living Expenses: Not on file  Food Insecurity:   . Worried About Charity fundraiser in the Last Year: Not on file  . Ran Out of Food in the Last Year: Not on file  Transportation Needs:   . Lack of Transportation (Medical): Not on file  . Lack of Transportation (Non-Medical): Not on file  Physical Activity:   . Days of Exercise per Week: Not on file  . Minutes of Exercise per Session: Not on file  Stress:   .  Feeling of Stress : Not on file  Social Connections:   . Frequency of Communication with Friends and Family: Not on file  . Frequency of Social Gatherings with Friends and Family: Not on file  . Attends Religious Services: Not on file  . Active Member of Clubs or Organizations: Not on file  . Attends Archivist Meetings: Not on file  . Marital Status: Not on file  Intimate Partner Violence:   . Fear of Current or Ex-Partner: Not on file  . Emotionally Abused: Not on file  . Physically Abused: Not on file  . Sexually Abused: Not on file    Past Surgical History:  Procedure Laterality Date  . cataract surgery  2013/2014   with lens implantation  . TUBAL LIGATION          Physical Exam: There were no vitals taken for this visit.    Affect appropriate Overweight black female  HEENT: normal Neck supple with no adenopathy JVP normal no bruits no thyromegaly Lungs clear with no wheezing and good diaphragmatic motion Heart:  S1/S2 no murmur, no rub, gallop or click PMI normal Abdomen: benighn, BS positve, no tenderness, no AAA no bruit.  No HSM or HJR Distal pulses intact with no bruits Trace edema with bilateral varicosities  Neuro non-focal Skin warm and dry No muscular weakness   Labs:   Lab Results  Component Value Date   WBC 4.3 (L) 10/13/2013   HGB 11.8 (L) 10/13/2013   HCT 36.4 10/13/2013   MCV 97.1 10/13/2013   PLT 138.0 (L) 10/13/2013   No results for input(s): NA, K, CL, CO2, BUN, CREATININE, CALCIUM, PROT, BILITOT, ALKPHOS, ALT, AST, GLUCOSE in the last 168 hours.  Invalid input(s): LABALBU No results found for: CKTOTAL, CKMB, CKMBINDEX, TROPONINI  Lab Results  Component Value Date   CHOL 201 (H) 10/13/2013   CHOL 196 10/08/2011   CHOL 205 (H) 05/29/2010   Lab Results  Component Value Date   HDL 68.40 10/13/2013   HDL 82.00 10/08/2011   HDL 80.80 05/29/2010   Lab Results  Component Value Date   LDLCALC 106 (H) 10/08/2011   LDLCALC 122 (H) 06/07/2009   LDLCALC 106 (H) 02/23/2008   Lab Results  Component Value Date   TRIG 55.0 10/13/2013   TRIG 39.0 10/08/2011   TRIG 36.0 05/29/2010   Lab Results  Component Value Date   CHOLHDL 3 10/13/2013   CHOLHDL 2 10/08/2011   CHOLHDL 3 05/29/2010   Lab Results  Component Value Date   LDLDIRECT 115.2 10/13/2013   LDLDIRECT 116.3 05/29/2010      Radiology: No results found.  EKG: ECG NSR rate 64 normal ECG today NSR normal rate 72    ASSESSMENT AND PLAN:   1. Fatigue : not cardiac related labs ok f/u echo to assess RV/LV function   2. Dyspnea:  Normal CXR and exam not anemic see above   3. HLDL  Diet Rx given lack of vascular disease   4. Venous Reflux:  Discussed compression stockings and elevation PRN diuretic per  primary Low sodium diet   Signed: [place] 08/05/2019, 10:36 AM

## 2019-08-05 NOTE — Telephone Encounter (Signed)
error 

## 2019-08-12 ENCOUNTER — Ambulatory Visit: Payer: Medicare Other | Admitting: Cardiology

## 2019-08-18 ENCOUNTER — Ambulatory Visit (HOSPITAL_COMMUNITY): Payer: Medicare Other | Attending: Cardiology

## 2019-08-18 ENCOUNTER — Other Ambulatory Visit: Payer: Self-pay

## 2019-08-18 DIAGNOSIS — E785 Hyperlipidemia, unspecified: Secondary | ICD-10-CM

## 2019-08-18 DIAGNOSIS — I872 Venous insufficiency (chronic) (peripheral): Secondary | ICD-10-CM | POA: Diagnosis not present

## 2019-08-18 DIAGNOSIS — R5383 Other fatigue: Secondary | ICD-10-CM

## 2019-08-18 DIAGNOSIS — R0600 Dyspnea, unspecified: Secondary | ICD-10-CM

## 2019-09-26 ENCOUNTER — Ambulatory Visit: Payer: Medicare Other

## 2019-09-30 ENCOUNTER — Ambulatory Visit: Payer: Medicare PPO | Attending: Internal Medicine

## 2019-09-30 DIAGNOSIS — Z23 Encounter for immunization: Secondary | ICD-10-CM | POA: Insufficient documentation

## 2019-09-30 NOTE — Progress Notes (Signed)
   Covid-19 Vaccination Clinic  Name:  Alexis Maldonado    MRN: [id number] DOB: 1946/03/06  09/30/2019  [name] was observed post Covid-19 immunization for 15 minutes without incidence. She was provided with Vaccine Information Sheet and instruction to access the V-Safe system.   [name] was instructed to call 911 with any severe reactions post vaccine: Marland Kitchen Difficulty breathing  . Swelling of your face and throat  . A fast heartbeat  . A bad rash all over your body  . Dizziness and weakness    Immunizations Administered    Name Date Dose VIS Date Route   Pfizer COVID-19 Vaccine 09/30/2019  8:53 AM 0.3 mL 08/06/2019 Intramuscular   Manufacturer: [place]   Lot: [id number]   [place]: [id number]

## 2019-10-02 ENCOUNTER — Ambulatory Visit: Payer: Medicare Other

## 2019-10-07 ENCOUNTER — Ambulatory Visit: Payer: Medicare Other

## 2019-10-25 ENCOUNTER — Ambulatory Visit: Payer: Medicare PPO | Attending: Internal Medicine

## 2019-10-25 DIAGNOSIS — Z23 Encounter for immunization: Secondary | ICD-10-CM | POA: Insufficient documentation

## 2019-10-25 NOTE — Progress Notes (Signed)
   Covid-19 Vaccination Clinic  Name:  Alexis Maldonado    MRN: [id number] DOB: 06/22/1946  10/25/2019  Alexis Maldonado was observed post Covid-19 immunization for 30 minutes based on pre-vaccination screening without incidence. She was provided with Vaccine Information Sheet and instruction to access the V-Safe system.   Alexis Maldonado was instructed to call 911 with any severe reactions post vaccine: Marland Kitchen Difficulty breathing  . Swelling of your face and throat  . A fast heartbeat  . A bad rash all over your body  . Dizziness and weakness    Immunizations Administered    Name Date Dose VIS Date Route   Pfizer COVID-19 Vaccine 10/25/2019  1:21 PM 0.3 mL 08/06/2019 Intramuscular   Manufacturer: [place]   Lot: [id number]   [place]: [id number]

## 2020-01-25 DIAGNOSIS — M545 Low back pain: Secondary | ICD-10-CM | POA: Diagnosis not present

## 2020-01-25 DIAGNOSIS — R2681 Unsteadiness on feet: Secondary | ICD-10-CM | POA: Diagnosis not present

## 2020-01-25 DIAGNOSIS — R269 Unspecified abnormalities of gait and mobility: Secondary | ICD-10-CM | POA: Diagnosis not present

## 2020-05-12 ENCOUNTER — Other Ambulatory Visit: Payer: Self-pay | Admitting: Internal Medicine

## 2020-05-12 DIAGNOSIS — Z1231 Encounter for screening mammogram for malignant neoplasm of breast: Secondary | ICD-10-CM

## 2020-05-17 ENCOUNTER — Other Ambulatory Visit: Payer: Medicare PPO

## 2020-05-17 DIAGNOSIS — Z20822 Contact with and (suspected) exposure to covid-19: Secondary | ICD-10-CM | POA: Diagnosis not present

## 2020-05-19 ENCOUNTER — Telehealth: Payer: Self-pay | Admitting: Internal Medicine

## 2020-05-19 LAB — SARS-COV-2, NAA 2 DAY TAT

## 2020-05-19 LAB — NOVEL CORONAVIRUS, NAA: SARS-CoV-2, NAA: NOT DETECTED

## 2020-05-19 NOTE — Telephone Encounter (Signed)
Negative COVID results given. Patient results "NOT Detected." Caller expressed understanding. ° °

## 2020-05-31 ENCOUNTER — Ambulatory Visit: Payer: Medicare PPO

## 2020-06-20 ENCOUNTER — Ambulatory Visit: Payer: Medicare PPO

## 2020-06-21 ENCOUNTER — Other Ambulatory Visit: Payer: Self-pay

## 2020-06-21 ENCOUNTER — Ambulatory Visit
Admission: RE | Admit: 2020-06-21 | Discharge: 2020-06-21 | Disposition: A | Discharge status: Home / Self Care | Payer: Medicare PPO | Source: Ambulatory Visit | Attending: Internal Medicine | Admitting: Internal Medicine

## 2020-06-21 DIAGNOSIS — Z1231 Encounter for screening mammogram for malignant neoplasm of breast: Secondary | ICD-10-CM | POA: Diagnosis not present

## 2020-06-29 DIAGNOSIS — Z23 Encounter for immunization: Secondary | ICD-10-CM | POA: Diagnosis not present

## 2020-07-13 ENCOUNTER — Ambulatory Visit: Payer: Medicare PPO | Attending: Internal Medicine

## 2020-07-13 DIAGNOSIS — Z23 Encounter for immunization: Secondary | ICD-10-CM

## 2020-07-13 NOTE — Progress Notes (Signed)
   Covid-19 Vaccination Clinic  Name:  Alexis Maldonado    MRN: [id number] DOB: 1945-09-01  07/13/2020  [name] was observed post Covid-19 immunization for 15 minutes without incident. She was provided with Vaccine Information Sheet and instruction to access the V-Safe system.   [name] was instructed to call 911 with any severe reactions post vaccine: Marland Kitchen Difficulty breathing  . Swelling of face and throat  . A fast heartbeat  . A bad rash all over body  . Dizziness and weakness   Immunizations Administered    Name Date Dose VIS Date Route   Pfizer COVID-19 Vaccine 07/13/2020  3:06 PM 0.3 mL 06/14/2020 Intramuscular   Manufacturer: [place]   Lot: [id number]   [place]: [id number]

## 2020-09-14 ENCOUNTER — Other Ambulatory Visit: Payer: Medicare PPO

## 2020-09-14 DIAGNOSIS — Z20822 Contact with and (suspected) exposure to covid-19: Secondary | ICD-10-CM | POA: Diagnosis not present

## 2020-09-15 LAB — NOVEL CORONAVIRUS, NAA: SARS-CoV-2, NAA: NOT DETECTED

## 2020-09-15 LAB — SARS-COV-2, NAA 2 DAY TAT

## 2020-10-03 DIAGNOSIS — R102 Pelvic and perineal pain: Secondary | ICD-10-CM | POA: Diagnosis not present

## 2020-10-03 DIAGNOSIS — N951 Menopausal and female climacteric states: Secondary | ICD-10-CM | POA: Diagnosis not present

## 2020-10-03 DIAGNOSIS — Z01419 Encounter for gynecological examination (general) (routine) without abnormal findings: Secondary | ICD-10-CM | POA: Diagnosis not present

## 2020-10-03 DIAGNOSIS — Z6830 Body mass index (BMI) 30.0-30.9, adult: Secondary | ICD-10-CM | POA: Diagnosis not present

## 2020-11-28 DIAGNOSIS — D649 Anemia, unspecified: Secondary | ICD-10-CM | POA: Diagnosis not present

## 2020-11-28 DIAGNOSIS — R7989 Other specified abnormal findings of blood chemistry: Secondary | ICD-10-CM | POA: Diagnosis not present

## 2020-12-05 DIAGNOSIS — R0683 Snoring: Secondary | ICD-10-CM | POA: Diagnosis not present

## 2020-12-05 DIAGNOSIS — Z0000 Encounter for general adult medical examination without abnormal findings: Secondary | ICD-10-CM | POA: Diagnosis not present

## 2020-12-05 DIAGNOSIS — R0600 Dyspnea, unspecified: Secondary | ICD-10-CM | POA: Diagnosis not present

## 2020-12-05 DIAGNOSIS — M25561 Pain in right knee: Secondary | ICD-10-CM | POA: Diagnosis not present

## 2020-12-05 DIAGNOSIS — D649 Anemia, unspecified: Secondary | ICD-10-CM | POA: Diagnosis not present

## 2020-12-05 DIAGNOSIS — R8290 Unspecified abnormal findings in urine: Secondary | ICD-10-CM | POA: Diagnosis not present

## 2020-12-05 DIAGNOSIS — M545 Low back pain, unspecified: Secondary | ICD-10-CM | POA: Diagnosis not present

## 2020-12-05 DIAGNOSIS — Z1212 Encounter for screening for malignant neoplasm of rectum: Secondary | ICD-10-CM | POA: Diagnosis not present

## 2020-12-05 DIAGNOSIS — D696 Thrombocytopenia, unspecified: Secondary | ICD-10-CM | POA: Diagnosis not present

## 2020-12-05 DIAGNOSIS — J309 Allergic rhinitis, unspecified: Secondary | ICD-10-CM | POA: Diagnosis not present

## 2020-12-05 DIAGNOSIS — I872 Venous insufficiency (chronic) (peripheral): Secondary | ICD-10-CM | POA: Diagnosis not present

## 2020-12-07 IMAGING — MG DIGITAL SCREENING BILAT W/ TOMO W/ CAD
8 series · 8 of 24 positions shown · non-contrast
Comparison: Previous exam(s).

CLINICAL DATA: Screening.

EXAM:
DIGITAL SCREENING BILATERAL MAMMOGRAM WITH TOMO AND CAD

[R CC synth-2D]
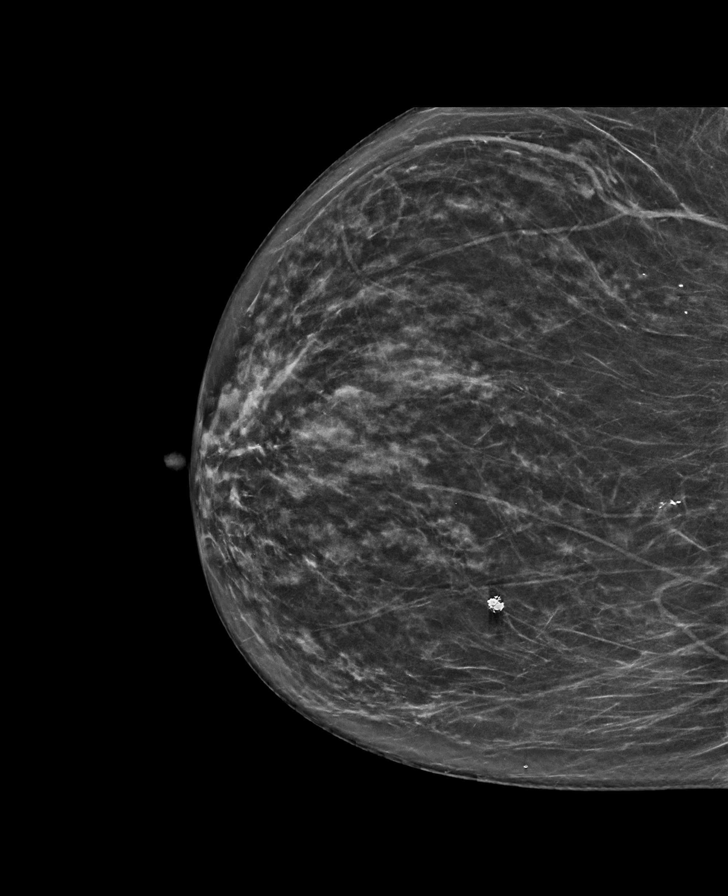

[R MLO synth-2D]
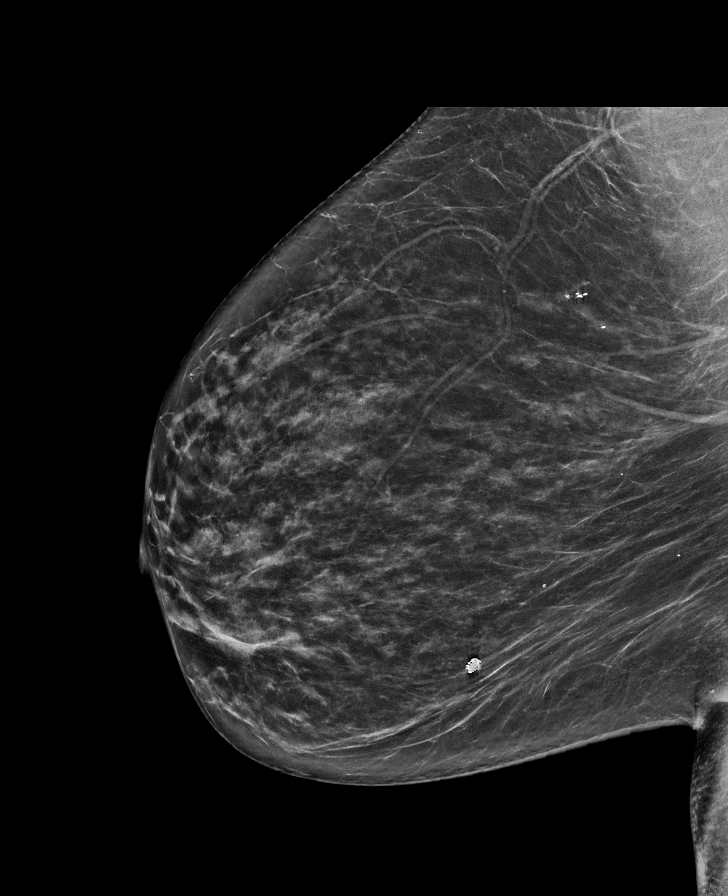

[L CC synth-2D]
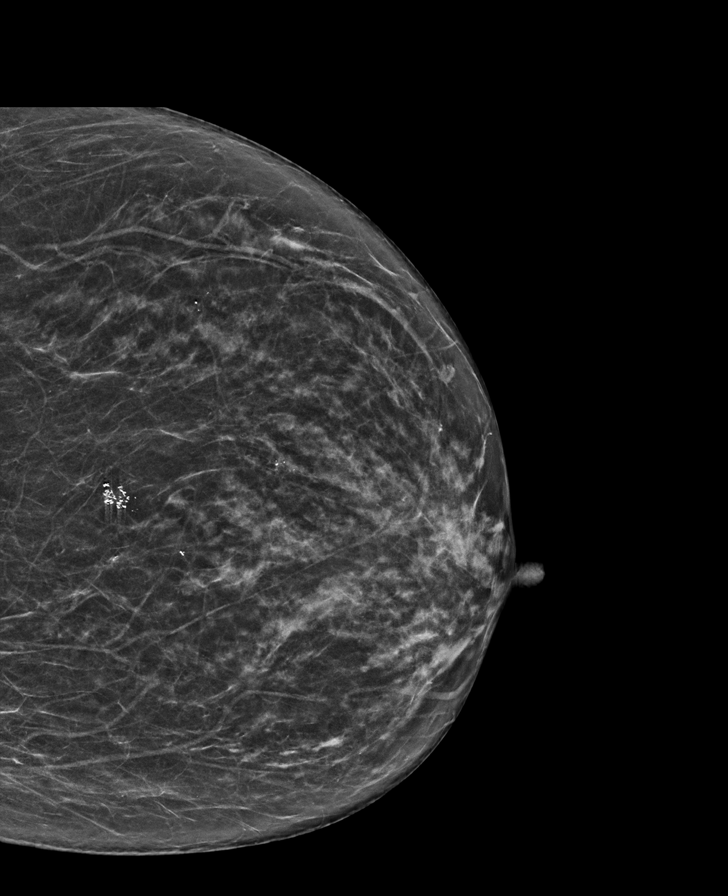

[L MLO synth-2D]
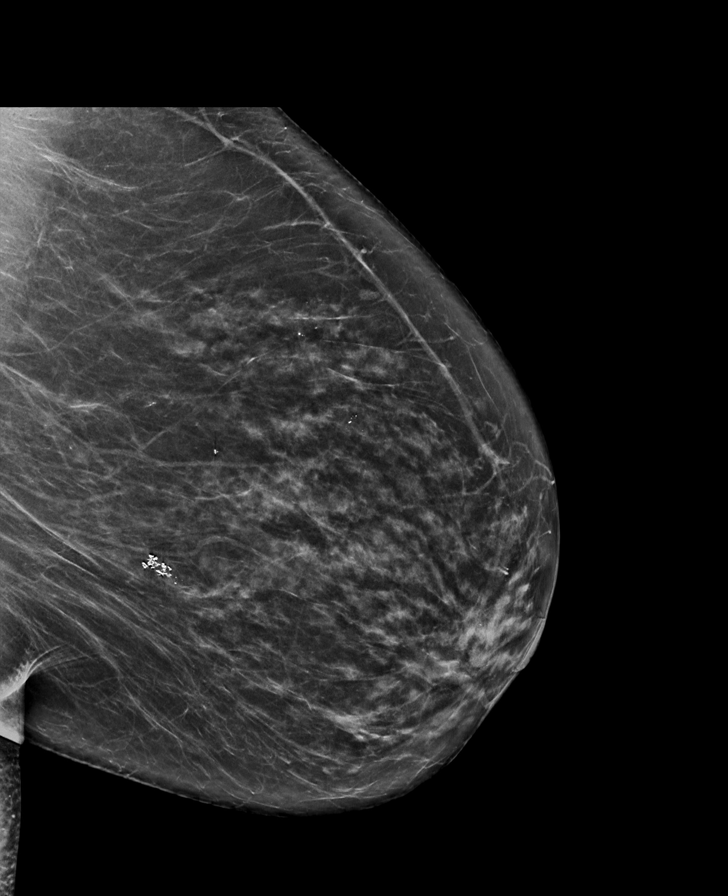

[R MLO tomo · tomo slice 37/73.0]
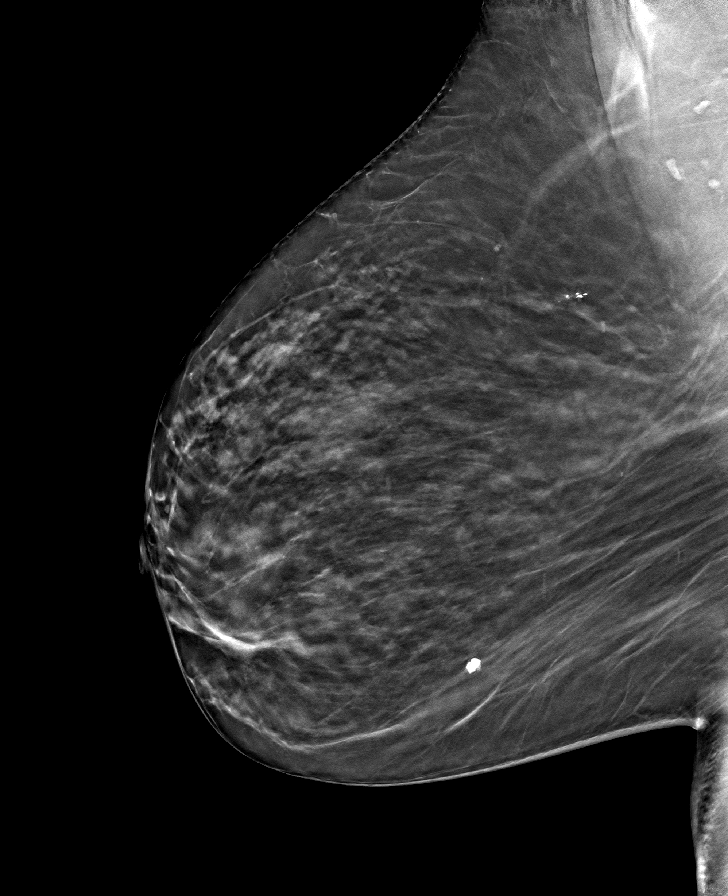

[R CC tomo · tomo slice 33/64.0]
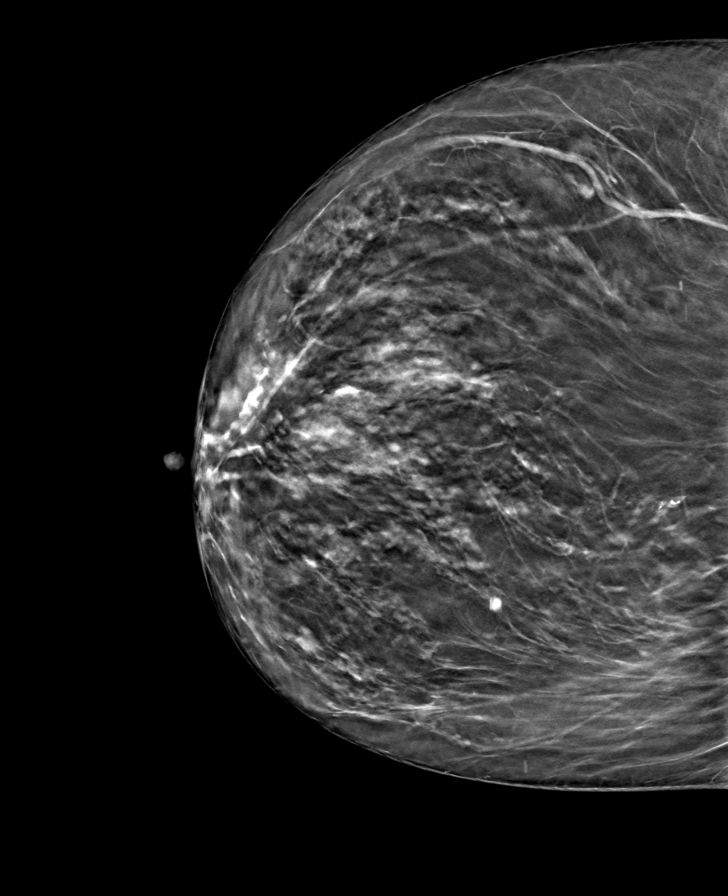

[L MLO tomo · tomo slice 37/72.0]
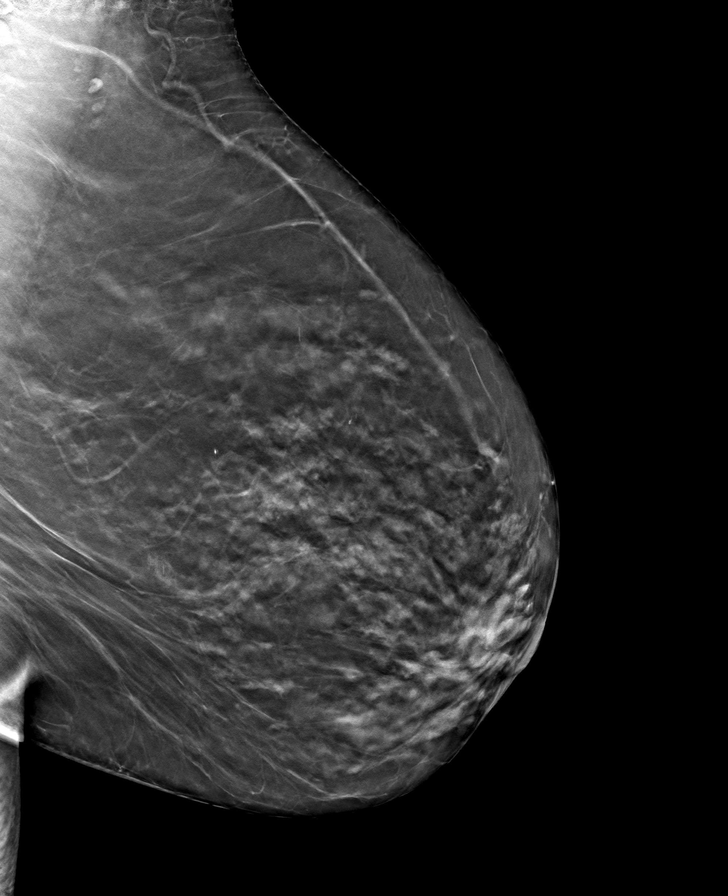

[L CC tomo · tomo slice 31/62.0]
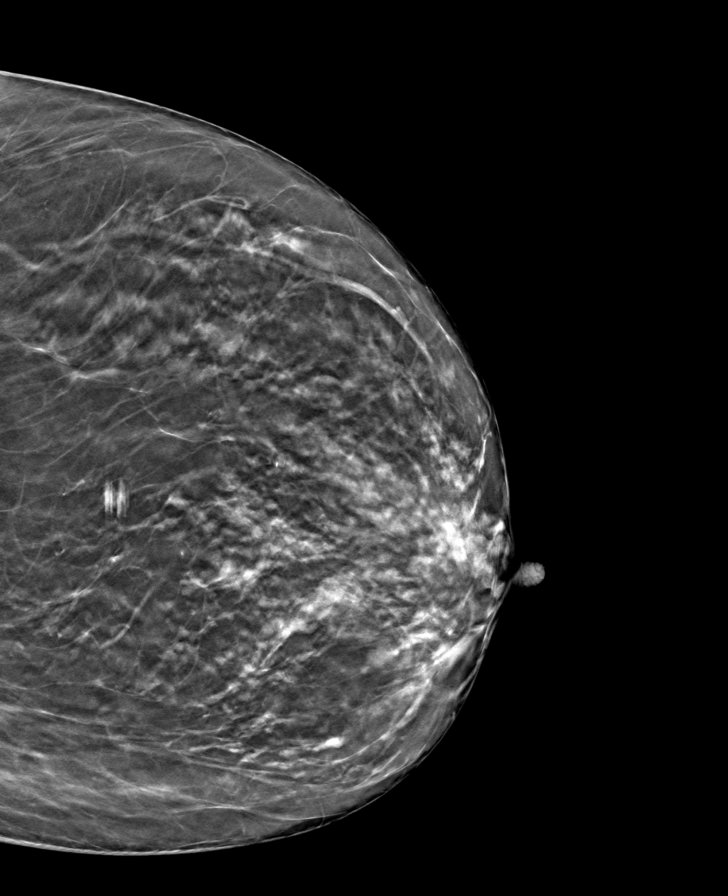

[8 of 24 positions shown; findings below may reference images not displayed]

ACR Breast Density Category c: The breast tissue is heterogeneously
dense, which may obscure small masses.
FINDINGS: There are no findings suspicious for malignancy. Images were
processed with CAD.
IMPRESSION: No mammographic evidence of malignancy. A result letter of this
screening mammogram will be mailed directly to the patient.

RECOMMENDATION:
Screening mammogram in one year. (Code:[id number])

BI-RADS CATEGORY  1: Negative.

## 2021-01-09 DIAGNOSIS — R102 Pelvic and perineal pain: Secondary | ICD-10-CM | POA: Diagnosis not present

## 2021-03-22 ENCOUNTER — Other Ambulatory Visit (HOSPITAL_BASED_OUTPATIENT_CLINIC_OR_DEPARTMENT_OTHER): Payer: Self-pay

## 2021-03-22 ENCOUNTER — Other Ambulatory Visit: Payer: Self-pay

## 2021-03-22 ENCOUNTER — Ambulatory Visit: Payer: Medicare PPO | Attending: Internal Medicine

## 2021-03-22 DIAGNOSIS — Z23 Encounter for immunization: Secondary | ICD-10-CM

## 2021-03-22 MED ORDER — PFIZER-BIONT COVID-19 VAC-TRIS 30 MCG/0.3ML IM SUSP
INTRAMUSCULAR | 0 refills | Status: DC
  Filled 2021-03-22: qty 0.3, 1d supply, fill #0

## 2021-03-22 NOTE — Progress Notes (Signed)
   Covid-19 Vaccination Clinic  Name:  Alexis Maldonado    MRN: [id number] DOB: [birth year]  03/22/2021  [name] was observed post Covid-19 immunization for 15 minutes without incident. She was provided with Vaccine Information Sheet and instruction to access the V-Safe system.   [name] was instructed to call 911 with any severe reactions post vaccine: Difficulty breathing  Swelling of face and throat  A fast heartbeat  A bad rash all over body  Dizziness and weakness   Immunizations Administered     Name Date Dose VIS Date Route   PFIZER Comrnaty(Gray TOP) Covid-19 Vaccine 03/22/2021 11:20 AM 0.3 mL 08/03/2020 Intramuscular   Manufacturer: [place]   Lot: [id number]   [place]: [phone]

## 2021-05-24 DIAGNOSIS — D649 Anemia, unspecified: Secondary | ICD-10-CM | POA: Diagnosis not present

## 2021-06-01 DIAGNOSIS — Z23 Encounter for immunization: Secondary | ICD-10-CM | POA: Diagnosis not present

## 2021-06-08 ENCOUNTER — Other Ambulatory Visit: Payer: Self-pay | Admitting: Internal Medicine

## 2021-06-08 DIAGNOSIS — Z1231 Encounter for screening mammogram for malignant neoplasm of breast: Secondary | ICD-10-CM

## 2021-06-11 DIAGNOSIS — R059 Cough, unspecified: Secondary | ICD-10-CM | POA: Diagnosis not present

## 2021-06-11 DIAGNOSIS — J069 Acute upper respiratory infection, unspecified: Secondary | ICD-10-CM | POA: Diagnosis not present

## 2021-06-11 DIAGNOSIS — J309 Allergic rhinitis, unspecified: Secondary | ICD-10-CM | POA: Diagnosis not present

## 2021-06-11 DIAGNOSIS — Z1152 Encounter for screening for COVID-19: Secondary | ICD-10-CM | POA: Diagnosis not present

## 2021-06-11 DIAGNOSIS — R5383 Other fatigue: Secondary | ICD-10-CM | POA: Diagnosis not present

## 2021-07-12 ENCOUNTER — Ambulatory Visit
Admission: RE | Admit: 2021-07-12 | Discharge: 2021-07-12 | Disposition: A | Discharge status: Home / Self Care | Payer: Medicare PPO | Source: Ambulatory Visit | Attending: Internal Medicine | Admitting: Internal Medicine

## 2021-07-12 ENCOUNTER — Other Ambulatory Visit: Payer: Self-pay

## 2021-07-12 DIAGNOSIS — Z1231 Encounter for screening mammogram for malignant neoplasm of breast: Secondary | ICD-10-CM

## 2021-08-02 DIAGNOSIS — H04123 Dry eye syndrome of bilateral lacrimal glands: Secondary | ICD-10-CM | POA: Diagnosis not present

## 2021-08-02 DIAGNOSIS — Z961 Presence of intraocular lens: Secondary | ICD-10-CM | POA: Diagnosis not present

## 2021-08-02 DIAGNOSIS — H43813 Vitreous degeneration, bilateral: Secondary | ICD-10-CM | POA: Diagnosis not present

## 2021-08-02 DIAGNOSIS — H524 Presbyopia: Secondary | ICD-10-CM | POA: Diagnosis not present

## 2021-08-02 DIAGNOSIS — H40013 Open angle with borderline findings, low risk, bilateral: Secondary | ICD-10-CM | POA: Diagnosis not present

## 2021-09-11 DIAGNOSIS — M1711 Unilateral primary osteoarthritis, right knee: Secondary | ICD-10-CM | POA: Diagnosis not present

## 2021-09-11 DIAGNOSIS — M25561 Pain in right knee: Secondary | ICD-10-CM | POA: Diagnosis not present

## 2021-12-25 DIAGNOSIS — D649 Anemia, unspecified: Secondary | ICD-10-CM | POA: Diagnosis not present

## 2021-12-25 DIAGNOSIS — R7989 Other specified abnormal findings of blood chemistry: Secondary | ICD-10-CM | POA: Diagnosis not present

## 2021-12-25 DIAGNOSIS — Z79899 Other long term (current) drug therapy: Secondary | ICD-10-CM | POA: Diagnosis not present

## 2022-01-01 DIAGNOSIS — M25561 Pain in right knee: Secondary | ICD-10-CM | POA: Diagnosis not present

## 2022-01-01 DIAGNOSIS — R2681 Unsteadiness on feet: Secondary | ICD-10-CM | POA: Diagnosis not present

## 2022-01-01 DIAGNOSIS — D61818 Other pancytopenia: Secondary | ICD-10-CM | POA: Diagnosis not present

## 2022-01-01 DIAGNOSIS — Z0000 Encounter for general adult medical examination without abnormal findings: Secondary | ICD-10-CM | POA: Diagnosis not present

## 2022-01-01 DIAGNOSIS — R82998 Other abnormal findings in urine: Secondary | ICD-10-CM | POA: Diagnosis not present

## 2022-01-01 DIAGNOSIS — Z1331 Encounter for screening for depression: Secondary | ICD-10-CM | POA: Diagnosis not present

## 2022-01-01 DIAGNOSIS — I872 Venous insufficiency (chronic) (peripheral): Secondary | ICD-10-CM | POA: Diagnosis not present

## 2022-01-01 DIAGNOSIS — Z1339 Encounter for screening examination for other mental health and behavioral disorders: Secondary | ICD-10-CM | POA: Diagnosis not present

## 2022-01-01 DIAGNOSIS — R5383 Other fatigue: Secondary | ICD-10-CM | POA: Diagnosis not present

## 2022-01-02 DIAGNOSIS — Z1239 Encounter for other screening for malignant neoplasm of breast: Secondary | ICD-10-CM | POA: Diagnosis not present

## 2022-01-02 DIAGNOSIS — Z13820 Encounter for screening for osteoporosis: Secondary | ICD-10-CM | POA: Diagnosis not present

## 2022-01-02 DIAGNOSIS — R109 Unspecified abdominal pain: Secondary | ICD-10-CM | POA: Diagnosis not present

## 2022-01-02 DIAGNOSIS — Z6829 Body mass index (BMI) 29.0-29.9, adult: Secondary | ICD-10-CM | POA: Diagnosis not present

## 2022-01-02 DIAGNOSIS — Z1211 Encounter for screening for malignant neoplasm of colon: Secondary | ICD-10-CM | POA: Diagnosis not present

## 2022-01-02 DIAGNOSIS — Z124 Encounter for screening for malignant neoplasm of cervix: Secondary | ICD-10-CM | POA: Diagnosis not present

## 2022-01-02 DIAGNOSIS — N952 Postmenopausal atrophic vaginitis: Secondary | ICD-10-CM | POA: Diagnosis not present

## 2022-03-19 DIAGNOSIS — K921 Melena: Secondary | ICD-10-CM | POA: Diagnosis not present

## 2022-03-19 DIAGNOSIS — N390 Urinary tract infection, site not specified: Secondary | ICD-10-CM | POA: Diagnosis not present

## 2022-03-19 DIAGNOSIS — L299 Pruritus, unspecified: Secondary | ICD-10-CM | POA: Diagnosis not present

## 2022-03-19 DIAGNOSIS — D61818 Other pancytopenia: Secondary | ICD-10-CM | POA: Diagnosis not present

## 2022-03-19 DIAGNOSIS — R350 Frequency of micturition: Secondary | ICD-10-CM | POA: Diagnosis not present

## 2022-03-21 ENCOUNTER — Telehealth: Payer: Self-pay | Admitting: Nurse Practitioner

## 2022-03-21 NOTE — Telephone Encounter (Signed)
Scheduled appt per 7/27 referral. Pt is aware of appt date and time. Pt is aware to arrive 15 mins prior to appt time and to bring and updated insurance card. Pt is aware of appt location.   

## 2022-03-26 DIAGNOSIS — M25561 Pain in right knee: Secondary | ICD-10-CM | POA: Diagnosis not present

## 2022-03-26 DIAGNOSIS — M1711 Unilateral primary osteoarthritis, right knee: Secondary | ICD-10-CM | POA: Diagnosis not present

## 2022-03-28 DIAGNOSIS — D649 Anemia, unspecified: Secondary | ICD-10-CM | POA: Diagnosis not present

## 2022-04-03 DIAGNOSIS — M1711 Unilateral primary osteoarthritis, right knee: Secondary | ICD-10-CM | POA: Diagnosis not present

## 2022-04-03 DIAGNOSIS — R531 Weakness: Secondary | ICD-10-CM | POA: Diagnosis not present

## 2022-04-03 DIAGNOSIS — M25661 Stiffness of right knee, not elsewhere classified: Secondary | ICD-10-CM | POA: Diagnosis not present

## 2022-04-08 ENCOUNTER — Encounter: Payer: Self-pay | Admitting: Nurse Practitioner

## 2022-04-08 ENCOUNTER — Other Ambulatory Visit: Payer: Self-pay

## 2022-04-08 ENCOUNTER — Inpatient Hospital Stay: Payer: Medicare PPO | Attending: Nurse Practitioner | Admitting: Nurse Practitioner

## 2022-04-08 ENCOUNTER — Inpatient Hospital Stay: Payer: Medicare PPO

## 2022-04-08 ENCOUNTER — Telehealth: Payer: Self-pay | Admitting: Hematology

## 2022-04-08 VITALS — BP 151/85 | HR 64 | Temp 97.9°F | Resp 15 | Ht 66.0 in | Wt 179.8 lb

## 2022-04-08 DIAGNOSIS — D61818 Other pancytopenia: Secondary | ICD-10-CM

## 2022-04-08 DIAGNOSIS — D649 Anemia, unspecified: Secondary | ICD-10-CM | POA: Diagnosis not present

## 2022-04-08 DIAGNOSIS — N390 Urinary tract infection, site not specified: Secondary | ICD-10-CM | POA: Diagnosis not present

## 2022-04-08 DIAGNOSIS — R634 Abnormal weight loss: Secondary | ICD-10-CM | POA: Insufficient documentation

## 2022-04-08 DIAGNOSIS — E538 Deficiency of other specified B group vitamins: Secondary | ICD-10-CM | POA: Insufficient documentation

## 2022-04-08 DIAGNOSIS — R5383 Other fatigue: Secondary | ICD-10-CM | POA: Diagnosis not present

## 2022-04-08 LAB — CMP (CANCER CENTER ONLY)
ALT: 12 U/L (ref 0–44)
AST: 16 U/L (ref 15–41)
Albumin: 4.3 g/dL (ref 3.5–5.0)
Alkaline Phosphatase: 81 U/L (ref 38–126)
Anion gap: 3 — ABNORMAL LOW (ref 5–15)
BUN: 14 mg/dL (ref 8–23)
CO2: 30 mmol/L (ref 22–32)
Calcium: 9.3 mg/dL (ref 8.9–10.3)
Chloride: 106 mmol/L (ref 98–111)
Creatinine: 0.72 mg/dL (ref 0.44–1.00)
GFR, Estimated: >60 mL/min (ref >60)
Glucose, Bld: 86 mg/dL (ref 70–99)
Potassium: 3.9 mmol/L (ref 3.5–5.1)
Sodium: 139 mmol/L (ref 135–145)
Total Bilirubin: 0.7 mg/dL (ref 0.3–1.2)
Total Protein: 7.2 g/dL (ref 6.5–8.1)

## 2022-04-08 LAB — CBC WITH DIFFERENTIAL (CANCER CENTER ONLY)
Abs Immature Granulocytes: 0.02 10*3/uL (ref 0.00–0.07)
Basophils Absolute: 0 10*3/uL (ref 0.0–0.1)
Basophils Relative: 1 %
Eosinophils Absolute: 0.1 10*3/uL (ref 0.0–0.5)
Eosinophils Relative: 2 %
HCT: 34.1 % — ABNORMAL LOW (ref 36.0–46.0)
Hemoglobin: 11.4 g/dL — ABNORMAL LOW (ref 12.0–15.0)
Immature Granulocytes: 1 %
Lymphocytes Relative: 24 %
Lymphs Abs: 1 10*3/uL (ref 0.7–4.0)
MCH: 32.5 pg (ref 26.0–34.0)
MCHC: 33.4 g/dL (ref 30.0–36.0)
MCV: 97.2 fL (ref 80.0–100.0)
Monocytes Absolute: 0.4 10*3/uL (ref 0.1–1.0)
Monocytes Relative: 9 %
Neutro Abs: 2.8 10*3/uL (ref 1.7–7.7)
Neutrophils Relative %: 63 %
Platelet Count: 133 10*3/uL — ABNORMAL LOW (ref 150–400)
RBC: 3.51 MIL/uL — ABNORMAL LOW (ref 3.87–5.11)
RDW: 12.8 % (ref 11.5–15.5)
Smear Review: NORMAL
WBC Count: 4.4 10*3/uL (ref 4.0–10.5)
nRBC: 0 % (ref 0.0–0.2)

## 2022-04-08 LAB — IRON AND IRON BINDING CAPACITY (CC-WL,HP ONLY)
Iron: 92 ug/dL (ref 28–170)
Saturation Ratios: 28 % (ref 10.4–31.8)
TIBC: 328 ug/dL (ref 250–450)
UIBC: 236 ug/dL (ref 148–442)

## 2022-04-08 LAB — VITAMIN B12: Vitamin B-12: 538 pg/mL (ref 180–914)

## 2022-04-08 LAB — RETIC PANEL
Immature Retic Fract: 12.6 % (ref 2.3–15.9)
RBC.: 3.48 MIL/uL — ABNORMAL LOW (ref 3.87–5.11)
Retic Count, Absolute: 39 10*3/uL (ref 19.0–186.0)
Retic Ct Pct: 1.1 % (ref 0.4–3.1)
Reticulocyte Hemoglobin: 34 pg (ref >27.9)

## 2022-04-08 LAB — HIV ANTIBODY (ROUTINE TESTING W REFLEX): HIV Screen 4th Generation wRfx: NONREACTIVE

## 2022-04-08 LAB — HEPATITIS C ANTIBODY: HCV Ab: NONREACTIVE

## 2022-04-08 LAB — HEPATITIS B SURFACE ANTIBODY,QUALITATIVE: Hep B S Ab: NONREACTIVE

## 2022-04-08 NOTE — Telephone Encounter (Signed)
Scheduled follow-up appointment per 8/14 los. Patient is aware. 

## 2022-04-08 NOTE — Progress Notes (Cosign Needed)
[place]   [phone] Fax:([phone]) [place] consult Note   Patient Care Team: Donnajean Lopes, MD as PCP - General (Internal Medicine) Donnajean Lopes, MD as Consulting Physician (Internal Medicine) Armbruster, Carlota Raspberry, MD as Consulting Physician (Gastroenterology) Truitt Merle, MD as Consulting Physician (Hematology) Alla Feeling, NP as Nurse Practitioner (Nurse Practitioner) 04/08/2022  CHIEF COMPLAINTS/PURPOSE OF CONSULTATION:  Pancytopenia, referred by PCP Dr. Leanna Battles  HISTORY OF PRESENTING ILLNESS:  Alexis Maldonado 76 y.o. female with no significant PMH is here because of pancytopenia.  She was found to have mild leukopenia and thrombocytopenia at annual wellness exam 01/01/2022, CBC showed with low WBC 3.46, decreased RBC 3.7 but normal hemoglobin 12.1, normal MCV, and low platelet 105K. CMP was unremarkable with normal kidney function. B12 level normal 347. She is taking oral B12. She was also found to have E.coli+ UTI at the time which was treated. She returned for visit 03/19/22 for itching which was felt to be related to allergies vs dry skin. Repeat CBC showed WBC 3.34, Hgb 11.5, and plt 108K. The differential remains normal. CMP also normal. She was given another round of antibiotics (macrobid) for UTI. She received B12 injection on 03/28/22. She was referred to [place] for further work up and recommendations for pancytopenia. Chart review shows mild pancytopenia since at least 05/2009 with lowest WBC 3.6, plt 119K, and hgb 11.8. She took oral iron for 1 year in 2018 - 2019 then was told to stop.  She denies history of abdominal or weight loss surgery, alcohol use, liver disease, or spleen issue.  She eats mostly a normal diet.  Denies autoimmune disorder, chronic, or recurrent infections.  Socially, she is married with 2 healthy adult sons.  She is retired from the bank in 2008 and also worked in [employer] at [employer] for 10 years.  She is  independent with ADLs and drives.  She is up-to-date on cancer screenings.  She denies alcohol, tobacco, or other drug use.  Family history significant for a brother with prostate cancer, followed by Dr. Irene Limbo.  Today she presents with her spouse.  She has morning fatigue for about 8 months that she attributes to being busy and active.  She takes an oral iron and B12 tablet if she knows she is going to be busy which helps her energy.  She has 5 pounds weight loss over 3 years.  Denies fever or night sweats.  Her stools have changed in the past 2 months, darker, with mixed solid and loose BMs.  She has an appointment to see Dr. Havery Moros.  Last colonoscopy in 2017 was unremarkable and she was discharged from screenings.  Denies pain, nausea, vomiting, bleeding.   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   Chronic pain of right knee    HYPERLIPIDEMIA 03/24/2008   INSOMNIA-SLEEP DISORDER-UNSPEC 01/24/2010   Left shoulder pain 10/08/2011   Lower back pain 10/13/2013   MENOPAUSAL DISORDER 03/24/2008   Qualifier: Diagnosis of  By: Jenny Reichmann MD, Hunt Oris    Osteoarthritis    Pain and swelling of lower leg    bilateral lower extremities   SINUSITIS- ACUTE-NOS 07/27/2010   Qualifier: Diagnosis of  By: Jenny Reichmann MD, Hunt Oris  Urinary urgency    Venous insufficiency    Vitamin D deficiency    Vulvitis     SURGICAL HISTORY: Past Surgical History:  Procedure Laterality Date   cataract surgery  2013/2014   with lens implantation   TUBAL LIGATION      SOCIAL HISTORY: Social History   Socioeconomic History   Marital status: Married    Spouse name: Not on file   Number of children: 2   Years of education: Not on file   Highest education level: Not on file  Occupational History   Not on file  Tobacco Use    Smoking status: Former    Types: Cigarettes   Smokeless tobacco: Never  Substance and Sexual Activity   Alcohol use: No   Drug use: No   Sexual activity: Yes    Partners: Male    Birth control/protection: Post-menopausal, None  Other Topics Concern   Not on file  Social History Narrative   Not on file   Social Determinants of Health   Financial Resource Strain: Not on file  Food Insecurity: Not on file  Transportation Needs: Not on file  Physical Activity: Not on file  Stress: Not on file  Social Connections: Not on file  Intimate Partner Violence: Not on file    FAMILY HISTORY: Family History  Problem Relation Age of Onset   Cancer Mother        Breast    Breast cancer Mother    Cancer Sister    Cancer Brother        prostate    ALLERGIES:  is allergic to prevnar [pneumococcal 13-val conj vacc], sulfa antibiotics, and tetanus toxoid.  MEDICATIONS:  Current Outpatient Medications  Medication Sig Dispense Refill   Calcium Carbonate (CALCIUM 500 PO) Take by mouth.     cetirizine (ZYRTEC) 10 MG tablet Take 10 mg by mouth daily.     cyclobenzaprine (FLEXERIL) 10 MG tablet Take 10 mg by mouth at bedtime.     estradiol (ESTRACE) 0.1 MG/GM vaginal cream Place 2 g vaginally daily.     fluticasone (FLONASE) 50 MCG/ACT nasal spray Place 2 sprays into both nostrils daily. 16 g 2   meloxicam (MOBIC) 15 MG tablet Take 15 mg by mouth daily.     solifenacin (VESICARE) 10 MG tablet Take 1 tablet (10 mg total) by mouth daily. 30 tablet 12   aspirin 81 MG tablet Take 81 mg by mouth daily. (Patient not taking: Reported on 04/08/2022)     docusate sodium (COLACE) 100 MG capsule Take 100 mg by mouth daily. (Patient not taking: Reported on 04/08/2022)     No current facility-administered medications for this visit.    REVIEW OF SYSTEMS:   Constitutional: Denies fevers, chills or abnormal night sweats (+) Hot flashes from menopause (+) 5 lbs weight loss in 3 years Eyes: Denies  blurriness of vision, double vision or watery eyes Ears, nose, mouth, throat, and face: Denies mucositis or sore throat Respiratory: Denies cough, dyspnea or wheezes Cardiovascular: Denies palpitation, chest discomfort or lower extremity swelling Gastrointestinal:  Denies nausea, vomiting, constipation, diarrhea, hematochezia, abdominal pain, heartburn (+) change in bowel habits (+) dark stools Skin: Denies abnormal skin rashes Lymphatics: Denies new lymphadenopathy or easy bruising Neurological:Denies numbness, tingling or new weaknesses Behavioral/Psych: Mood is stable, no new changes  All other systems were reviewed with the patient and are negative.  PHYSICAL EXAMINATION: ECOG PERFORMANCE STATUS: 0 - Asymptomatic  Vitals:   04/08/22 1130  BP: (!) 151/85  Pulse: 64  Resp: 15  Temp: 97.9 F (36.6 C)  SpO2: 99%   Filed Weights   04/08/22 1130  Weight: 179 lb 12.8 oz (81.6 kg)    GENERAL:alert, no distress and comfortable SKIN: no rash  EYES: sclera clear NECK: without mass LYMPH:  no palpable cervical, supraclavicular lymphadenopathy  LUNGS: clear with normal breathing effort HEART: regular rate & rhythm, no lower extremity edema ABDOMEN:abdomen soft, non-tender and normal bowel sounds Musculoskeletal:no cyanosis of digits and no clubbing  PSYCH: alert & oriented x 3 with fluent speech NEURO: no focal motor/sensory deficits  LABORATORY DATA:  I have reviewed the data as listed    Latest Ref Rng & Units 04/08/2022   12:43 PM 10/13/2013    5:18 PM 10/08/2011   11:40 AM  CBC  WBC 4.0 - 10.5 K/uL 4.4  4.3  4.1   Hemoglobin 12.0 - 15.0 g/dL 11.4  11.8  11.9   Hematocrit 36.0 - 46.0 % 34.1  36.4  35.5   Platelets 150 - 400 K/uL 133  138.0  127.0        Latest Ref Rng & Units 04/08/2022   12:43 PM 10/13/2013    5:18 PM 10/08/2011   11:40 AM  CMP  Glucose 70 - 99 mg/dL 86  79  85   BUN 8 - 23 mg/dL '14  19  18   ' Creatinine 0.44 - 1.00 mg/dL 0.72  0.6  0.7   Sodium 135  - 145 mmol/L 139  139  141   Potassium 3.5 - 5.1 mmol/L 3.9  3.9  4.3   Chloride 98 - 111 mmol/L 106  105  103   CO2 22 - 32 mmol/L '30  29  31   ' Calcium 8.9 - 10.3 mg/dL 9.3  9.2  9.5   Total Protein 6.5 - 8.1 g/dL 7.2  7.5  7.4   Total Bilirubin 0.3 - 1.2 mg/dL 0.7  0.5  0.7   Alkaline Phos 38 - 126 U/L 81  76  67   AST 15 - 41 U/L '16  23  20   ' ALT 0 - 44 U/L '12  20  15      ' RADIOGRAPHIC STUDIES: I have personally reviewed the radiological images as listed and agreed with the findings in the report. No results found.  ASSESSMENT & PLAN: 76 yo female with   1.Pancytopenia -we reviewed her medical record in the detail with the patient and her spouse. She has had mild leukopenia and thrombocytopenia and intermittent anemia since at least 2010, WBC 3.6, hgb 11.5, and plt 119 in the past -she denies h/o autoimmune disorder, chronic infection, abdominal/weight loss surgeries, alcohol use, hepatitis, liver or spleen disease, or CKD -platelet count dropped slightly in the past 3 months to 105K, WBC and hemoglobin are stable.  Differential remains normal.  Denies bleeding -she has started oral B12 supplement and received 1 injection on 8/3. We will check B12 level today. OK to continue injection at PCP's office q2-3 months. Will r/o other nutritional causes such as iron or folate deficiency, and check MMA as well. -Ms. Brouse appears stable. She is asymptomatic except for mild morning fatigue she attributes to her busy schedule.  -She is agreeable to further work up including abdominal US and labs to rule out nutritional causes, and hep B, C, HIV, and retic.  I will call her with results.  -We discussed the possibility of an indolent primary bone marrow condition such as myelodysplastic syndrome (  MDS).  We are not recommending a bone marrow biopsy at this time.  Even if she has low-grade MDS, she would not require treatment with her counts at this time -We recommend to continue observation, next  labs at PCP in 3 months, then we will see her in 6 months.  If labs remain stable we can see her annually -Patient seen by Dr. Burr Medico  2.Change in bowel habits, dark stool -Last colonoscopy 2017 showed internal hemorrhoids, otherwise normal  -In the past 2 months she reports BMs have changed to include a mixture of solid and loose stools which are dark, without obvious bleeding -On oral iron intermittently -Follow-up with GI Dr. Havery Moros scheduled on 05/01/22  3.Age appropriate health maintenance, wellness  -Per PCP Dr. Bevelyn Buckles  Plan: -Record record reviewed -Labs today including CBC, CMP, retic and I will, iron, folate, B12, MMA, hep B, hep C, HIV -Abdominal ultrasound in 1-2 weeks, I will call her with the results of her work-up -GI appointment with Dr. Havery Moros 9/6 as scheduled  -CBC with PCP in 3 months -Continue B12 injections per PCP -Lab and follow-up with [place] in 6 months -Seen by Dr. Burr Medico  Orders Placed This Encounter  Procedures   US Abdomen Complete    Standing Status:   Future    Standing Expiration Date:   04/08/2023    Order Specific Question:   Reason for Exam (SYMPTOM  OR DIAGNOSIS REQUIRED)    Answer:   pancytopenia, r/o liver disease and spleen abnormality    Order Specific Question:   Preferred imaging location?    Answer:   [facility]   CBC with Differential (Cancer Center Only)    Standing Status:   Standing    Number of Occurrences:   1    Standing Expiration Date:   04/09/2023   CMP (Cancer Center only)    Standing Status:   Standing    Number of Occurrences:   1    Standing Expiration Date:   04/09/2023   Ferritin    Standing Status:   Standing    Number of Occurrences:   1    Standing Expiration Date:   04/09/2023   Iron and Iron Binding Capacity ([facility] only)    Standing Status:   Standing    Number of Occurrences:   1    Standing Expiration Date:   04/09/2023   Retic Panel    Standing Status:   Standing    Number of  Occurrences:   1    Standing Expiration Date:   04/09/2023   Folate RBC    Standing Status:   Standing    Number of Occurrences:   1    Standing Expiration Date:   04/09/2023   Methylmalonic acid, serum    Standing Status:   Standing    Number of Occurrences:   1    Standing Expiration Date:   04/09/2023   Vitamin B12    Standing Status:   Standing    Number of Occurrences:   1    Standing Expiration Date:   04/09/2023   HIV antibody (with reflex)    Standing Status:   Standing    Number of Occurrences:   1    Standing Expiration Date:   04/09/2023   Hepatitis C antibody    Standing Status:   Standing    Number of Occurrences:   1    Standing Expiration Date:   04/09/2023   Hepatitis B surface  antibody    Standing Status:   Standing    Number of Occurrences:   1    Standing Expiration Date:   04/09/2023     All questions were answered. The patient knows to call the clinic with any problems, questions or concerns.     Alla Feeling, NP 04/08/22   Addendum I have seen the patient, examined her. I agree with the assessment and and plan and have edited the notes.   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  Her thrombocytopenia is slightly worse lately.  She does have borderline B12 deficiency, which can cause pancytopenia.  She has been on B12 supplement, and received 1 dose injection recently.  We will repeat CBC and also check hepatitis B and C, HIV and [place] of liver and spleen.  Given the chronic course, and mild pancytopenia, I think it is okay to hold on bone marrow biopsy for now.  Will monitor her CBC. All questions were answered.   Truitt Merle  04/08/2022

## 2022-04-09 LAB — FERRITIN: Ferritin: 156 ng/mL (ref 11–307)

## 2022-04-10 LAB — FOLATE RBC
Folate, Hemolysate: 342 ng/mL
Folate, RBC: 953 ng/mL (ref >498)
Hematocrit: 35.9 % (ref 34.0–46.6)

## 2022-04-10 LAB — METHYLMALONIC ACID, SERUM: Methylmalonic Acid, Quantitative: 88 nmol/L (ref 0–378)

## 2022-04-12 ENCOUNTER — Encounter: Payer: Self-pay | Admitting: Nurse Practitioner

## 2022-04-24 ENCOUNTER — Other Ambulatory Visit: Payer: Self-pay

## 2022-04-25 ENCOUNTER — Telehealth: Payer: Self-pay

## 2022-04-25 ENCOUNTER — Other Ambulatory Visit: Payer: Self-pay

## 2022-04-25 NOTE — Telephone Encounter (Signed)
Pt LVM requesting for this RN to return her telephone call.  Pt did not state what she needed.  This RN returned pts called but unfortunately got pt's voicemail.  LVM asking pt to return my call if needed.

## 2022-04-30 ENCOUNTER — Ambulatory Visit (HOSPITAL_COMMUNITY)
Admission: RE | Admit: 2022-04-30 | Discharge: 2022-04-30 | Disposition: A | Discharge status: Home / Self Care | Payer: Medicare PPO | Source: Ambulatory Visit | Attending: Nurse Practitioner | Admitting: Nurse Practitioner

## 2022-04-30 DIAGNOSIS — D61818 Other pancytopenia: Secondary | ICD-10-CM | POA: Insufficient documentation

## 2022-04-30 DIAGNOSIS — K7689 Other specified diseases of liver: Secondary | ICD-10-CM | POA: Diagnosis not present

## 2022-05-01 ENCOUNTER — Ambulatory Visit: Payer: Medicare PPO | Admitting: Gastroenterology

## 2022-05-01 ENCOUNTER — Other Ambulatory Visit: Payer: Self-pay | Admitting: Gastroenterology

## 2022-05-01 ENCOUNTER — Encounter: Payer: Self-pay | Admitting: Gastroenterology

## 2022-05-01 VITALS — BP 128/78 | HR 71 | Ht 65.0 in | Wt 177.0 lb

## 2022-05-01 DIAGNOSIS — D649 Anemia, unspecified: Secondary | ICD-10-CM

## 2022-05-01 DIAGNOSIS — K760 Fatty (change of) liver, not elsewhere classified: Secondary | ICD-10-CM

## 2022-05-01 DIAGNOSIS — R194 Change in bowel habit: Secondary | ICD-10-CM

## 2022-05-01 DIAGNOSIS — K625 Hemorrhage of anus and rectum: Secondary | ICD-10-CM

## 2022-05-01 MED ORDER — NA SULFATE-K SULFATE-MG SULF 17.5-3.13-1.6 GM/177ML PO SOLN: 1.0000 | Freq: Once | ORAL | 0 refills | Status: AC

## 2022-05-01 NOTE — Progress Notes (Signed)
HPI :  76 year old female with a history of fatty liver, allergic rhinitis, arthritis, here to reestablish care for altered bowel habits and fatty liver, referred by Leanna Battles, MD.  I have not seen her since January 2017.  She is accompanied by her husband today.  She states for the past few months her bowels have been altered.  She is seen a small amount of blood in her stool on a few occasions back in July or so.  She has not seen it recently.  She has been having intermittent dark stools as well although has been taking iron intermittently, perhaps 3 days a week or so.  She was told she had low iron levels however her iron levels have been normal that I can review in epic.  She states she has loose stools about 50% of the time and then 50% of the time her stools are normal.  She can have some urgency with her bowels at times and did have an accident in recent months.  Normally she will move her bowels once daily.  She does not have any abdominal pains that are bothering her.  She seems to be eating okay, no weight loss, has a good appetite.  No GERD, no dysphagia.  She does have some fatigue for the past 8 to 9 months and sounds like she was taking iron for this.  She was found to have pancytopenia that has persisted and been seen by hematology recently.  She has a normocytic anemia, iron studies are normal while on iron, B12 normal.  Thrombocytopenia noted.  She had an abdominal ultrasound done to rule out cirrhosis and was noted to have fatty liver.  Her spleen was normal and there is no overt evidence of cirrhosis.  She has meloxicam listed on her medication list but she states she is not taking it.  Using Tylenol only as needed.  She denies any new medications and relates to her bowel habit changes.  She denies any alcohol use at baseline.  States her weight has been stable.  She had a colonoscopy with me in January 2017 which was normal.  She had internal hemorrhoids noted as source of  bleeding symptoms she was having at that time.  US abdomen 04/30/22: IMPRESSION: Mild diffuse increased echotexture of the liver is noted, this is nonspecific but can be seen in fatty infiltration of liver. 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   Chronic pain of right knee    HYPERLIPIDEMIA 03/24/2008   INSOMNIA-SLEEP DISORDER-UNSPEC 01/24/2010   Left shoulder pain 10/08/2011   Lower back pain 10/13/2013   MENOPAUSAL DISORDER 03/24/2008   Qualifier: Diagnosis of  By: Jenny Reichmann MD, Hunt Oris    Osteoarthritis    Pain and swelling of lower leg    bilateral lower extremities   SINUSITIS- ACUTE-NOS 07/27/2010   Qualifier: Diagnosis of  By: Jenny Reichmann MD, Hunt Oris    Urinary urgency    Venous insufficiency    Vitamin D deficiency    Vulvitis      Past Surgical History:  Procedure Laterality Date   cataract surgery  2013/2014   with lens implantation   TUBAL LIGATION     Family History  Problem Relation Age of Onset  Cancer Mother        Breast    Breast cancer Mother    Cancer Sister    Cancer Brother        prostate   Social History   Tobacco Use   Smoking status: Former    Types: Cigarettes   Smokeless tobacco: Never  Substance Use Topics   Alcohol use: No   Drug use: No   Current Outpatient Medications  Medication Sig Dispense Refill   Calcium Carbonate (CALCIUM 500 PO) Take by mouth.     cetirizine (ZYRTEC) 10 MG tablet Take 10 mg by mouth daily.     docusate sodium (COLACE) 100 MG capsule Take 100 mg by mouth daily.     estradiol (ESTRACE) 0.1 MG/GM vaginal cream Place 2 g vaginally daily.     fluticasone (FLONASE) 50 MCG/ACT nasal spray Place 2 sprays into both nostrils daily. 16 g 2   aspirin 81 MG tablet Take 81 mg by mouth daily. (Patient not  taking: Reported on 05/01/2022)     cyclobenzaprine (FLEXERIL) 10 MG tablet Take 10 mg by mouth at bedtime. (Patient not taking: Reported on 05/01/2022)     meloxicam (MOBIC) 15 MG tablet Take 15 mg by mouth daily. (Patient not taking: Reported on 05/01/2022)     solifenacin (VESICARE) 10 MG tablet Take 1 tablet (10 mg total) by mouth daily. (Patient not taking: Reported on 05/01/2022) 30 tablet 12   No current facility-administered medications for this visit.   Allergies  Allergen Reactions   Prevnar [Pneumococcal 13-Val Conj Vacc]    Sulfa Antibiotics    Tetanus Toxoid     REACTION: sob and rash     Review of Systems: All systems reviewed and negative except where noted in HPI.    US Abdomen Complete  Result Date: 04/30/2022 CLINICAL DATA:  Pancytopenic.  Assess for liver disease. EXAM: ABDOMEN ULTRASOUND COMPLETE COMPARISON:  None Available. FINDINGS: Gallbladder: No gallstones or wall thickening visualized. No sonographic Murphy sign noted by sonographer. Common bile duct: Diameter: 1.6 mm Liver: No focal lesion identified. Mild diffuse increased echotexture of the liver is noted. Portal vein is patent on color Doppler imaging with normal direction of blood flow towards the liver. IVC: No abnormality visualized. Pancreas: Visualized portion unremarkable. Spleen: Size and appearance within normal limits. The spleen measures 6.06 cm in length with splenic volume of 89.72. Right Kidney: Length: 9.89 cm. Echogenicity within normal limits. No mass or hydronephrosis visualized. Left Kidney: Length: 9.84 cm. Echogenicity within normal limits. No mass or hydronephrosis visualized. Abdominal aorta: No aneurysm visualized. Other findings: None. IMPRESSION: Mild diffuse increased echotexture of the liver is noted, this is nonspecific but can be seen in fatty infiltration of liver. Electronically Signed   By: Abelardo Diesel M.D.   On: 04/30/2022 14:12    Lab Results  Component Value Date   WBC 4.4 04/08/2022    HGB 11.4 (L) 04/08/2022   HCT 35.9 04/08/2022   MCV 97.2 04/08/2022   PLT 133 (L) 04/08/2022    Lab Results  Component Value Date   CREATININE 0.72 04/08/2022   BUN 14 04/08/2022   NA 139 04/08/2022   K 3.9 04/08/2022   CL 106 04/08/2022   CO2 30 04/08/2022    Lab Results  Component Value Date   ALT 12 04/08/2022   AST 16 04/08/2022   ALKPHOS 81 04/08/2022   BILITOT 0.7 04/08/2022    Lab Results  Component Value Date   IRON 92 04/08/2022  TIBC 328 04/08/2022   FERRITIN 156 04/08/2022      Physical Exam: BP 128/78   Pulse 71   Ht '5\' 5"'$  (1.651 m)   Wt 177 lb (80.3 kg)   SpO2 95%   BMI 29.45 kg/m  Constitutional: Pleasant,well-developed, female in no acute distress. HEENT: Normocephalic and atraumatic. Conjunctivae are normal. No scleral icterus. Neck supple.  Cardiovascular: Normal rate, regular rhythm.  Pulmonary/chest: Effort normal and breath sounds normal.  Abdominal: Soft, nondistended, nontender.  There are no masses palpable. No hepatomegaly. DRE / Anoscopy - Grace Isaac NP student as standby - internal hemorrhoids, no mass lesions or polyps appreciated. No fissure  Extremities: no edema Lymphadenopathy: No cervical adenopathy noted. Neurological: Alert and oriented to person place and time. Skin: Skin is warm and dry. No rashes noted. Psychiatric: Normal mood and affect. Behavior is normal.   ASSESSMENT: 76 y.o. female here for assessment of the following  1. Bowel habit changes   2. Rectal bleeding   3. Anemia, unspecified type   4. Fatty liver    History as above.  Altered bowel habit changes with intermittent loose stool with urgency for the past few months, associated with few episodes of rectal bleeding.  No medication changes to be causing this.  Her last colonoscopy was normal and reassuring.  She does have some hemorrhoids on anoscopy today and that could very likely be the source of her bleeding, however she has pancytopenia, her iron  studies are confounded by her taking oral iron on her own for several months.  Discussed differential diagnosis with her.  I offered her colonoscopy to make sure she does not have any colitis to clarify source of her rectal bleeding given her symptoms, given her last exam was over 6 and half years ago.  I discussed risk benefits of colonoscopy and anesthesia with her and she wanted to proceed.  In the interim she can try some Citrucel once daily to provide some bowel regularity and use Imodium as needed for diarrhea or when leaving the house if she has been having some urgency.  She is in agreement with the plan.  Otherwise, we reviewed her ultrasound results.  She appears to have fatty liver, no cirrhosis on imaging to account for her thrombocytopenia or anemia.  She does not drink any alcohol, weight generally stable.  I counseled her on fatty liver in general, risks for fibrosis and cirrhosis.  Needs to have LFTs checked yearly.  Needs to monitor weight and maintain normal body weight.  She understands.  PLAN: - schedule colonoscopy to be done at the [facility] - start Citrucel once daily - can use Immodium PRN - counseled on fatty liver, LFTs yearlly, no cirrhosis on [place], f/u hematology for anemia  Jolly Mango, MD [facility]  CC: Donnajean Lopes, MD

## 2022-05-01 NOTE — Patient Instructions (Signed)
Start over the counter Citracel once daily.   You have been scheduled for a colonoscopy. Please follow written instructions given to you at your visit today.  Please pick up your prep supplies at the pharmacy within the next 1-3 days. If you use inhalers (even only as needed), please bring them with you on the day of your procedure.  The [facility] providers would like to encourage you to use [facility] to communicate with providers for non-urgent requests or questions.  Due to long hold times on the telephone, sending your provider a message by [facility] may be a faster and more efficient way to get a response.  Please allow 48 business hours for a response.  Please remember that this is for non-urgent requests.   Due to recent changes in healthcare laws, you may see the results of your imaging and laboratory studies on MyChart before your provider has had a chance to review them.  We understand that in some cases there may be results that are confusing or concerning to you. Not all laboratory results come back in the same time frame and the provider may be waiting for multiple results in order to interpret others.  Please give [place] 48 hours in order for your provider to thoroughly review all the results before contacting the office for clarification of your results.

## 2022-06-13 ENCOUNTER — Encounter: Payer: Self-pay | Admitting: Gastroenterology

## 2022-06-13 ENCOUNTER — Other Ambulatory Visit: Payer: Self-pay | Admitting: Gastroenterology

## 2022-06-13 ENCOUNTER — Telehealth: Payer: Self-pay | Admitting: Gastroenterology

## 2022-06-13 DIAGNOSIS — Z23 Encounter for immunization: Secondary | ICD-10-CM | POA: Diagnosis not present

## 2022-06-13 MED ORDER — NA SULFATE-K SULFATE-MG SULF 17.5-3.13-1.6 GM/177ML PO SOLN: 1.0000 | Freq: Once | ORAL | 0 refills | Status: AC

## 2022-06-13 NOTE — Telephone Encounter (Signed)
Prescription resent to patient's pharmacy. 

## 2022-06-13 NOTE — Telephone Encounter (Signed)
Spoke with patient today she stated that [facility] not [facility] for [facility].  Could [facility] please [facility]

## 2022-06-20 ENCOUNTER — Encounter: Payer: Self-pay | Admitting: Gastroenterology

## 2022-06-20 ENCOUNTER — Ambulatory Visit (AMBULATORY_SURGERY_CENTER): Payer: Medicare PPO | Admitting: Gastroenterology

## 2022-06-20 VITALS — BP 126/68 | HR 70 | Temp 98.7°F | Resp 18 | Ht 65.0 in | Wt 177.0 lb

## 2022-06-20 DIAGNOSIS — E785 Hyperlipidemia, unspecified: Secondary | ICD-10-CM | POA: Diagnosis not present

## 2022-06-20 DIAGNOSIS — D120 Benign neoplasm of cecum: Secondary | ICD-10-CM | POA: Diagnosis not present

## 2022-06-20 DIAGNOSIS — R194 Change in bowel habit: Secondary | ICD-10-CM

## 2022-06-20 DIAGNOSIS — F419 Anxiety disorder, unspecified: Secondary | ICD-10-CM | POA: Diagnosis not present

## 2022-06-20 DIAGNOSIS — K625 Hemorrhage of anus and rectum: Secondary | ICD-10-CM

## 2022-06-20 DIAGNOSIS — R195 Other fecal abnormalities: Secondary | ICD-10-CM | POA: Diagnosis not present

## 2022-06-20 DIAGNOSIS — K649 Unspecified hemorrhoids: Secondary | ICD-10-CM

## 2022-06-20 MED ORDER — SODIUM CHLORIDE 0.9 % IV SOLN: 500.0000 mL | Freq: Once | INTRAVENOUS | Status: DC

## 2022-06-20 NOTE — Progress Notes (Signed)
Called to room to assist during endoscopic procedure.  Patient ID and intended procedure confirmed with present staff. Received instructions for my participation in the procedure from the performing physician.  

## 2022-06-20 NOTE — Progress Notes (Signed)
To PACU, VSS. Report to [email or web address] 

## 2022-06-20 NOTE — Progress Notes (Signed)
[facility] History and Physical   Primary Care Physician:  Alexis Lopes, MD   Reason for Procedure:   Altered bowel habits, rectal bleeding  Plan:    colonoscopy     HPI: Alexis Maldonado is a 76 y.o. female  here for colonoscopy to further evaluate symptoms above. Last exam 08/2015 looked normal. Intermittent loose stools with urgency and occasional bleeding No family history of colon cancer known. Otherwise feels well without any cardiopulmonary symptoms.   I have discussed risks / benefits of anesthesia and endoscopic procedure with Alexis Maldonado and they wish to proceed with the exams as outlined today.    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   Chronic pain of right knee    HYPERLIPIDEMIA 03/24/2008   INSOMNIA-SLEEP DISORDER-UNSPEC 01/24/2010   Left shoulder pain 10/08/2011   Lower back pain 10/13/2013   MENOPAUSAL DISORDER 03/24/2008   Qualifier: Diagnosis of  By: Alexis Reichmann MD, Alexis Maldonado    Osteoarthritis    Pain and swelling of lower leg    bilateral lower extremities   SINUSITIS- ACUTE-NOS 07/27/2010   Qualifier: Diagnosis of  By: Alexis Reichmann MD, Alexis Maldonado    Urinary urgency    Venous insufficiency    Vitamin D deficiency    Vulvitis     Past Surgical History:  Procedure Laterality Date   cataract surgery  2013/2014   with lens implantation   TUBAL LIGATION      Prior to Admission medications   Medication Sig Start Date End Date Taking? Authorizing Provider  estradiol (ESTRACE) 0.1 MG/GM vaginal cream Place 2 g vaginally daily.   Yes [provider]  fluticasone (FLONASE) 50 MCG/ACT nasal spray Place 2 sprays into both nostrils daily. 10/13/13  Yes Alexis Borg, MD  aspirin 81 MG tablet Take 81 mg by mouth daily. Patient not taking: Reported on 05/01/2022    [provider]  Calcium Carbonate (CALCIUM 500 PO) Take by mouth. Patient not taking: Reported on 06/20/2022    [provider]  cetirizine (ZYRTEC) 10 MG tablet Take 10 mg by mouth daily. Patient not taking: Reported on 06/20/2022    [provider]  cyclobenzaprine (FLEXERIL) 10 MG tablet Take 10 mg by mouth at bedtime. Patient not taking: Reported on 05/01/2022    [provider]  docusate sodium (COLACE) 100 MG capsule Take 100 mg by mouth daily. Patient not taking: Reported on 06/20/2022    [provider]  meloxicam (MOBIC) 15 MG tablet Take 15 mg by mouth daily. Patient not taking: Reported on 05/01/2022    [provider]  solifenacin (VESICARE) 10 MG tablet Take 1 tablet (10 mg total) by mouth daily. Patient not taking: Reported on 05/01/2022 07/02/12   Alexis Manges, MD    Current Outpatient Medications  Medication Sig Dispense Refill   estradiol (ESTRACE) 0.1 MG/GM vaginal cream Place 2 g vaginally daily.     fluticasone (FLONASE) 50 MCG/ACT nasal spray Place 2 sprays into both nostrils daily. 16 g 2   aspirin 81 MG tablet Take 81 mg by mouth daily. (Patient not taking: Reported on 05/01/2022)     Calcium Carbonate (CALCIUM 500 PO) Take by mouth. (Patient not taking: Reported on 06/20/2022)     cetirizine (ZYRTEC) 10 MG tablet  Take 10 mg by mouth daily. (Patient not taking: Reported on 06/20/2022)     cyclobenzaprine (FLEXERIL) 10 MG tablet Take 10 mg by mouth at bedtime. (Patient not taking: Reported on 05/01/2022)     docusate sodium (COLACE) 100 MG capsule Take 100 mg by mouth daily. (Patient not taking: Reported on 06/20/2022)     meloxicam (MOBIC) 15 MG tablet Take 15 mg by mouth daily. (Patient not taking: Reported on 05/01/2022)     solifenacin (VESICARE) 10 MG tablet Take 1 tablet (10 mg total) by mouth daily. (Patient not taking: Reported on 05/01/2022) 30 tablet 12   Current Facility-Administered Medications  Medication Dose Route  Frequency Provider Last Rate Last Admin   0.9 %  sodium chloride infusion  500 mL Intravenous Once Alexis Maldonado, Alexis Raspberry, MD        Allergies as of 06/20/2022 - Review Complete 06/20/2022  Allergen Reaction Noted   Prevnar [pneumococcal 13-val conj vacc]  10/14/2013   Sulfa antibiotics  08/05/2019   Tetanus toxoid  06/07/2009    Family History  Problem Relation Age of Onset   Cancer Mother        Breast    Breast cancer Mother    Cancer Sister    Cancer Brother        prostate    Social History   Socioeconomic History   Marital status: Married    Spouse name: Not on file   Number of children: 2   Years of education: Not on file   Highest education level: Not on file  Occupational History   Not on file  Tobacco Use   Smoking status: Former    Types: Cigarettes   Smokeless tobacco: Never  Substance and Sexual Activity   Alcohol use: No   Drug use: No   Sexual activity: Yes    Partners: Male    Birth control/protection: Post-menopausal, None  Other Topics Concern   Not on file  Social History Narrative   Not on file   Social Determinants of Health   Financial Resource Strain: Not on file  Food Insecurity: Not on file  Transportation Needs: Not on file  Physical Activity: Not on file  Stress: Not on file  Social Connections: Not on file  Intimate Partner Violence: Not on file    Review of Systems: All other review of systems negative except as mentioned in the HPI.  Physical Exam: Vital signs BP (!) 150/80   Pulse 70   Temp 98.7 F (37.1 C)   Ht '5\' 5"'$  (1.651 m)   Wt 177 lb (80.3 kg)   SpO2 98%   BMI 29.45 kg/m   General:   Alert,  Well-developed, pleasant and cooperative in NAD Lungs:  Clear throughout to auscultation.   Heart:  Regular rate and rhythm Abdomen:  Soft, nontender and nondistended.   Neuro/Psych:  Alert and cooperative. Normal mood and affect. A and O x 3  Alexis Mango, MD [facility]

## 2022-06-20 NOTE — Patient Instructions (Addendum)
-   Patient has a contact number available for emergencies. The signs and symptoms of potential delayed complications were discussed with the patient. Return to normal activities tomorrow. Written discharge instructions were provided to the patient. - Resume previous diet. - Continue present medications. - Await pathology results. - Citrucel daily -Handout on polyps, hemorrhoids provided   YOU HAD AN ENDOSCOPIC PROCEDURE TODAY AT [facility]:   Refer to the procedure report that was given to you for any specific questions about what was found during the examination.  If the procedure report does not answer your questions, please call your gastroenterologist to clarify.  If you requested that your care partner not be given the details of your procedure findings, then the procedure report has been included in a sealed envelope for you to review at your convenience later.  YOU SHOULD EXPECT: Some feelings of bloating in the abdomen. Passage of more gas than usual.  Walking can help get rid of the air that was put into your GI tract during the procedure and reduce the bloating. If you had a lower endoscopy (such as a colonoscopy or flexible sigmoidoscopy) you may notice spotting of blood in your stool or on the toilet paper. If you underwent a bowel prep for your procedure, you may not have a normal bowel movement for a few days.  Please Note:  You might notice some irritation and congestion in your nose or some drainage.  This is from the oxygen used during your procedure.  There is no need for concern and it should clear up in a day or so.  SYMPTOMS TO REPORT IMMEDIATELY:  Following lower endoscopy (colonoscopy or flexible sigmoidoscopy):  Excessive amounts of blood in the stool  Significant tenderness or worsening of abdominal pains  Swelling of the abdomen that is new, acute  Fever of 100F or higher   For urgent or emergent issues, a gastroenterologist can be reached at any hour  by calling [phone]. Do not use MyChart messaging for urgent concerns.    DIET:  We do recommend a small meal at first, but then you may proceed to your regular diet.  Drink plenty of fluids but you should avoid alcoholic beverages for 24 hours.  ACTIVITY:  You should plan to take it easy for the rest of today and you should NOT DRIVE or use heavy machinery until tomorrow (because of the sedation medicines used during the test).    FOLLOW UP: Our staff will call the number listed on your records the next business day following your procedure.  We will call around 7:15- 8:00 am to check on you and address any questions or concerns that you may have regarding the information given to you following your procedure. If we do not reach you, we will leave a message.     If any biopsies were taken you will be contacted by phone or by letter within the next 1-3 weeks.  Please call us at [phone] if you have not heard about the biopsies in 3 weeks.    SIGNATURES/CONFIDENTIALITY: You and/or your care partner have signed paperwork which will be entered into your electronic medical record.  These signatures attest to the fact that that the information above on your After Visit Summary has been reviewed and is understood.  Full responsibility of the confidentiality of this discharge information lies with you and/or your care-partner.

## 2022-06-20 NOTE — Op Note (Signed)
[facility] Patient Name: Alexis Maldonado Procedure Date: 06/20/2022 9:40 AM MRN: [id number] Endoscopist: Remo Lipps P. Havery Moros , MD, [id number] Age: 76 Referring MD:  Date of Birth: July 03, 1946 Gender: Female Account #: [id number] Procedure:                Colonoscopy Indications:              altered bowel habits (occasional constipation with                            intermittent loose stools / urgency) with                            intermittent rectal bleeding - less symptoms since                            I have seen her in the office, feeling better                            recently Medicines:                Monitored Anesthesia Care Procedure:                Pre-Anesthesia Assessment:                           - Prior to the procedure, a History and Physical                            was performed, and patient medications and                            allergies were reviewed. The patient's tolerance of                            previous anesthesia was also reviewed. The risks                            and benefits of the procedure and the sedation                            options and risks were discussed with the patient.                            All questions were answered, and informed consent                            was obtained. Prior Anticoagulants: The patient has                            taken no anticoagulant or antiplatelet agents. ASA                            Grade Assessment: II - A patient with mild systemic  disease. After reviewing the risks and benefits,                            the patient was deemed in satisfactory condition to                            undergo the procedure.                           After obtaining informed consent, the colonoscope                            was passed under direct vision. Throughout the                            procedure, the patient's blood pressure, pulse, and                             oxygen saturations were monitored continuously. The                            PCF-HQ190L Colonoscope was introduced through the                            anus and advanced to the the terminal ileum, with                            identification of the appendiceal orifice and IC                            valve. The colonoscopy was performed without                            difficulty. The patient tolerated the procedure                            well. The quality of the bowel preparation was                            good. The terminal ileum, ileocecal valve,                            appendiceal orifice, and rectum were photographed. Scope In: 9:43:25 AM Scope Out: 10:03:57 AM Scope Withdrawal Time: 0 hours 16 minutes 46 seconds  Total Procedure Duration: 0 hours 20 minutes 32 seconds  Findings:                 The perianal and digital rectal examinations were                            normal.                           The terminal ileum appeared normal.  A 3 mm polyp was found in the cecum. The polyp was                            sessile. The polyp was removed with a cold biopsy                            forceps. Resection and retrieval were complete.                           A few small-mouthed diverticula were found in the                            ascending colon.                           Internal hemorrhoids were found during                            retroflexion. The hemorrhoids were small.                           The exam was otherwise without abnormality.                           Biopsies for histology were taken with a cold                            forceps from the right colon, left colon and                            transverse colon for evaluation of microscopic                            colitis. Complications:            No immediate complications. Estimated blood loss:                             Minimal. Estimated Blood Loss:     Estimated blood loss was minimal. Impression:               - The examined portion of the ileum was normal.                           - One 3 mm polyp in the cecum, removed with a cold                            biopsy forceps. Resected and retrieved.                           - Diverticulosis in the ascending colon.                           - Internal hemorrhoids.                           -  The examination was otherwise normal.                           - Biopsies were taken with a cold forceps from the                            right colon, left colon and transverse colon for                            evaluation of microscopic colitis.                           Suspect hemorrhoidal bleeding in the setting of                            loose stools in regards to rectal bleeding. Recommendation:           - Patient has a contact number available for                            emergencies. The signs and symptoms of potential                            delayed complications were discussed with the                            patient. Return to normal activities tomorrow.                            Written discharge instructions were provided to the                            patient.                           - Resume previous diet.                           - Continue present medications.                           - Await pathology results.                           - [place] daily Edy Belt P. Havery Moros, MD 06/20/2022 10:09:52 AM This report has been signed electronically.

## 2022-06-21 ENCOUNTER — Telehealth: Payer: Self-pay | Admitting: *Deleted

## 2022-06-21 NOTE — Telephone Encounter (Signed)
  Follow up Call-     06/20/2022    9:07 AM  Call back number  Post procedure Call Back phone  # [phone]  Permission to leave phone message Yes     Patient questions:  Do you have a fever, pain , or abdominal swelling? No. Pain Score  0 *  Have you tolerated food without any problems? Yes.    Have you been able to return to your normal activities? Yes.    Do you have any questions about your discharge instructions: Diet   No. Medications  No. Follow up visit  No.  Do you have questions or concerns about your Care? No.  Actions: * If pain score is 4 or above: No action needed, pain <4.

## 2022-07-09 DIAGNOSIS — M25561 Pain in right knee: Secondary | ICD-10-CM | POA: Diagnosis not present

## 2022-07-09 DIAGNOSIS — D61818 Other pancytopenia: Secondary | ICD-10-CM | POA: Diagnosis not present

## 2022-07-09 DIAGNOSIS — R6889 Other general symptoms and signs: Secondary | ICD-10-CM | POA: Diagnosis not present

## 2022-07-09 DIAGNOSIS — I872 Venous insufficiency (chronic) (peripheral): Secondary | ICD-10-CM | POA: Diagnosis not present

## 2022-07-09 DIAGNOSIS — R5383 Other fatigue: Secondary | ICD-10-CM | POA: Diagnosis not present

## 2022-08-20 DIAGNOSIS — D61818 Other pancytopenia: Secondary | ICD-10-CM | POA: Diagnosis not present

## 2022-08-20 DIAGNOSIS — Z1152 Encounter for screening for COVID-19: Secondary | ICD-10-CM | POA: Diagnosis not present

## 2022-08-20 DIAGNOSIS — R059 Cough, unspecified: Secondary | ICD-10-CM | POA: Diagnosis not present

## 2022-08-20 DIAGNOSIS — J029 Acute pharyngitis, unspecified: Secondary | ICD-10-CM | POA: Diagnosis not present

## 2022-08-20 DIAGNOSIS — R5383 Other fatigue: Secondary | ICD-10-CM | POA: Diagnosis not present

## 2022-08-20 DIAGNOSIS — R0981 Nasal congestion: Secondary | ICD-10-CM | POA: Diagnosis not present

## 2022-08-20 DIAGNOSIS — J069 Acute upper respiratory infection, unspecified: Secondary | ICD-10-CM | POA: Diagnosis not present

## 2022-09-12 DIAGNOSIS — M1711 Unilateral primary osteoarthritis, right knee: Secondary | ICD-10-CM | POA: Diagnosis not present

## 2022-09-20 ENCOUNTER — Other Ambulatory Visit: Payer: Self-pay

## 2022-10-02 ENCOUNTER — Telehealth: Payer: Self-pay | Admitting: Nurse Practitioner

## 2022-10-02 NOTE — Telephone Encounter (Signed)
Contacted patient to scheduled appointments. Patient is aware of appointments that are scheduled.   

## 2022-10-03 DIAGNOSIS — R262 Difficulty in walking, not elsewhere classified: Secondary | ICD-10-CM | POA: Diagnosis not present

## 2022-10-03 DIAGNOSIS — M25661 Stiffness of right knee, not elsewhere classified: Secondary | ICD-10-CM | POA: Diagnosis not present

## 2022-10-03 DIAGNOSIS — M1731 Unilateral post-traumatic osteoarthritis, right knee: Secondary | ICD-10-CM | POA: Diagnosis not present

## 2022-10-06 ENCOUNTER — Other Ambulatory Visit: Payer: Self-pay | Admitting: Nurse Practitioner

## 2022-10-06 DIAGNOSIS — D61818 Other pancytopenia: Secondary | ICD-10-CM

## 2022-10-06 NOTE — Progress Notes (Unsigned)
Patient Care Team: Donnajean Lopes, MD as PCP - General (Internal Medicine) Donnajean Lopes, MD as Consulting Physician (Internal Medicine) Armbruster, Carlota Raspberry, MD as Consulting Physician (Gastroenterology) Truitt Merle, MD as Consulting Physician (Hematology) Alla Feeling, NP as Nurse Practitioner (Nurse Practitioner)   CHIEF COMPLAINT: Follow up pancytopenia   Oncology History   No history exists.     CURRENT THERAPY: Surveillance   INTERVAL HISTORY Alexis Maldonado returns for follow up as scheduled. Last seen as a new patient 04/08/22 for pancytopenia. Work up showed negative HepB/C and HIV serologies, normal iron, b12, and folate. Abd [place] in 04/2022 showed mildly increased liver echotexture. Colonoscopy 05/2022 showed an adenomatous polyp (Armbruster).   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   Chronic pain of right knee    HYPERLIPIDEMIA 03/24/2008   INSOMNIA-SLEEP DISORDER-UNSPEC 01/24/2010   Left shoulder pain 10/08/2011   Lower back pain 10/13/2013   MENOPAUSAL DISORDER 03/24/2008   Qualifier: Diagnosis of  By: Jenny Reichmann MD, Hunt Oris    Osteoarthritis    Pain and swelling of lower leg    bilateral lower extremities   SINUSITIS- ACUTE-NOS 07/27/2010   Qualifier: Diagnosis of  By: Jenny Reichmann MD, Hunt Oris    Urinary urgency    Venous insufficiency    Vitamin D deficiency    Vulvitis      Past Surgical History:  Procedure Laterality Date   cataract surgery  2013/2014   with lens implantation   TUBAL LIGATION       Outpatient Encounter Medications as of 10/08/2022  Medication Sig   aspirin 81 MG tablet Take 81 mg by mouth daily. (Patient not taking: Reported on 05/01/2022)   Calcium Carbonate (CALCIUM 500 PO) Take by mouth. (Patient not taking: Reported on 06/20/2022)   cetirizine (ZYRTEC)  10 MG tablet Take 10 mg by mouth daily. (Patient not taking: Reported on 06/20/2022)   cyclobenzaprine (FLEXERIL) 10 MG tablet Take 10 mg by mouth at bedtime. (Patient not taking: Reported on 05/01/2022)   docusate sodium (COLACE) 100 MG capsule Take 100 mg by mouth daily. (Patient not taking: Reported on 06/20/2022)   estradiol (ESTRACE) 0.1 MG/GM vaginal cream Place 2 g vaginally daily.   fluticasone (FLONASE) 50 MCG/ACT nasal spray Place 2 sprays into both nostrils daily.   meloxicam (MOBIC) 15 MG tablet Take 15 mg by mouth daily. (Patient not taking: Reported on 05/01/2022)   solifenacin (VESICARE) 10 MG tablet Take 1 tablet (10 mg total) by mouth daily. (Patient not taking: Reported on 05/01/2022)   No facility-administered encounter medications on file as of 10/08/2022.     There were no vitals filed for this visit. There is no height or weight on file to calculate BMI.   PHYSICAL EXAM GENERAL:alert, no distress and comfortable SKIN: no rash  EYES: sclera clear NECK: without mass LYMPH:  no palpable cervical or supraclavicular lymphadenopathy  LUNGS: clear with normal breathing effort HEART: regular rate & rhythm, no lower extremity edema ABDOMEN: abdomen soft, non-tender and normal bowel sounds NEURO: alert & oriented x 3 with fluent speech, no focal motor/sensory deficits Breast exam:  PAC without erythema    CBC    Component Value Date/Time   WBC 4.4 04/08/2022 1243   WBC 4.3 (L) 10/13/2013 1718  RBC 3.48 (L) 04/08/2022 1246   RBC 3.51 (L) 04/08/2022 1243   HGB 11.4 (L) 04/08/2022 1243   HCT 35.9 04/08/2022 1245   HCT 34.1 (L) 04/08/2022 1243   PLT 133 (L) 04/08/2022 1243   MCV 97.2 04/08/2022 1243   MCH 32.5 04/08/2022 1243   MCHC 33.4 04/08/2022 1243   RDW 12.8 04/08/2022 1243   LYMPHSABS 1.0 04/08/2022 1243   MONOABS 0.4 04/08/2022 1243   EOSABS 0.1 04/08/2022 1243   BASOSABS 0.0 04/08/2022 1243     CMP     Component Value Date/Time   NA 139 04/08/2022 1243    K 3.9 04/08/2022 1243   CL 106 04/08/2022 1243   CO2 30 04/08/2022 1243   GLUCOSE 86 04/08/2022 1243   BUN 14 04/08/2022 1243   CREATININE 0.72 04/08/2022 1243   CALCIUM 9.3 04/08/2022 1243   PROT 7.2 04/08/2022 1243   ALBUMIN 4.3 04/08/2022 1243   AST 16 04/08/2022 1243   ALT 12 04/08/2022 1243   ALKPHOS 81 04/08/2022 1243   BILITOT 0.7 04/08/2022 1243   GFRNONAA >60 04/08/2022 1243   GFRAA 108 02/23/2008 0900     ASSESSMENT & PLAN:  PLAN:  No orders of the defined types were placed in this encounter.     All questions were answered. The patient knows to call the clinic with any problems, questions or concerns. No barriers to learning were detected. I spent *** counseling the patient face to face. The total time spent in the appointment was *** and more than 50% was on counseling, review of test results, and coordination of care.   Cira Rue, NP-C @DATE$ @

## 2022-10-08 ENCOUNTER — Inpatient Hospital Stay: Payer: Medicare PPO | Admitting: Nurse Practitioner

## 2022-10-08 ENCOUNTER — Inpatient Hospital Stay: Payer: Medicare PPO | Attending: Nurse Practitioner

## 2022-10-08 ENCOUNTER — Ambulatory Visit: Payer: Medicare PPO | Admitting: Nurse Practitioner

## 2022-10-08 ENCOUNTER — Other Ambulatory Visit: Payer: Medicare PPO

## 2022-10-08 ENCOUNTER — Encounter: Payer: Self-pay | Admitting: Nurse Practitioner

## 2022-10-08 VITALS — BP 146/77 | HR 58 | Temp 99.9°F | Resp 18 | Ht 65.0 in | Wt 177.2 lb

## 2022-10-08 DIAGNOSIS — D61818 Other pancytopenia: Secondary | ICD-10-CM | POA: Insufficient documentation

## 2022-10-08 DIAGNOSIS — K648 Other hemorrhoids: Secondary | ICD-10-CM | POA: Diagnosis not present

## 2022-10-08 DIAGNOSIS — Z79899 Other long term (current) drug therapy: Secondary | ICD-10-CM | POA: Insufficient documentation

## 2022-10-08 DIAGNOSIS — G8929 Other chronic pain: Secondary | ICD-10-CM | POA: Insufficient documentation

## 2022-10-08 LAB — CBC WITH DIFFERENTIAL (CANCER CENTER ONLY)
Abs Immature Granulocytes: 0.01 K/uL (ref 0.00–0.07)
Basophils Absolute: 0 K/uL (ref 0.0–0.1)
Basophils Relative: 1 %
Eosinophils Absolute: 0.1 K/uL (ref 0.0–0.5)
Eosinophils Relative: 3 %
HCT: 35.7 % — ABNORMAL LOW (ref 36.0–46.0)
Hemoglobin: 11.8 g/dL — ABNORMAL LOW (ref 12.0–15.0)
Immature Granulocytes: 0 %
Lymphocytes Relative: 39 %
Lymphs Abs: 1.4 K/uL (ref 0.7–4.0)
MCH: 32 pg (ref 26.0–34.0)
MCHC: 33.1 g/dL (ref 30.0–36.0)
MCV: 96.7 fL (ref 80.0–100.0)
Monocytes Absolute: 0.3 K/uL (ref 0.1–1.0)
Monocytes Relative: 9 %
Neutro Abs: 1.7 K/uL (ref 1.7–7.7)
Neutrophils Relative %: 48 %
Platelet Count: 128 K/uL — ABNORMAL LOW (ref 150–400)
RBC: 3.69 MIL/uL — ABNORMAL LOW (ref 3.87–5.11)
RDW: 12.4 % (ref 11.5–15.5)
WBC Count: 3.6 K/uL — ABNORMAL LOW (ref 4.0–10.5)
nRBC: 0 % (ref 0.0–0.2)

## 2022-10-08 LAB — CMP (CANCER CENTER ONLY)
ALT: 12 U/L (ref 0–44)
AST: 20 U/L (ref 15–41)
Albumin: 4.2 g/dL (ref 3.5–5.0)
Alkaline Phosphatase: 83 U/L (ref 38–126)
Anion gap: 3 — ABNORMAL LOW (ref 5–15)
BUN: 13 mg/dL (ref 8–23)
CO2: 33 mmol/L — ABNORMAL HIGH (ref 22–32)
Calcium: 10.2 mg/dL (ref 8.9–10.3)
Chloride: 105 mmol/L (ref 98–111)
Creatinine: 0.71 mg/dL (ref 0.44–1.00)
GFR, Estimated: >60 mL/min (ref >60)
Glucose, Bld: 88 mg/dL (ref 70–99)
Potassium: 3.9 mmol/L (ref 3.5–5.1)
Sodium: 141 mmol/L (ref 135–145)
Total Bilirubin: 0.7 mg/dL (ref 0.3–1.2)
Total Protein: 7.4 g/dL (ref 6.5–8.1)

## 2022-10-23 DIAGNOSIS — M1711 Unilateral primary osteoarthritis, right knee: Secondary | ICD-10-CM | POA: Diagnosis not present

## 2022-10-24 DIAGNOSIS — Z0189 Encounter for other specified special examinations: Secondary | ICD-10-CM | POA: Diagnosis not present

## 2022-11-01 DIAGNOSIS — M21061 Valgus deformity, not elsewhere classified, right knee: Secondary | ICD-10-CM | POA: Diagnosis not present

## 2022-11-01 DIAGNOSIS — M1711 Unilateral primary osteoarthritis, right knee: Secondary | ICD-10-CM | POA: Diagnosis not present

## 2022-11-01 DIAGNOSIS — Z96651 Presence of right artificial knee joint: Secondary | ICD-10-CM | POA: Diagnosis not present

## 2022-11-01 DIAGNOSIS — G8918 Other acute postprocedural pain: Secondary | ICD-10-CM | POA: Diagnosis not present

## 2022-11-03 DIAGNOSIS — Z791 Long term (current) use of non-steroidal anti-inflammatories (NSAID): Secondary | ICD-10-CM | POA: Diagnosis not present

## 2022-11-03 DIAGNOSIS — Z96651 Presence of right artificial knee joint: Secondary | ICD-10-CM | POA: Diagnosis not present

## 2022-11-03 DIAGNOSIS — Z7982 Long term (current) use of aspirin: Secondary | ICD-10-CM | POA: Diagnosis not present

## 2022-11-03 DIAGNOSIS — Z471 Aftercare following joint replacement surgery: Secondary | ICD-10-CM | POA: Diagnosis not present

## 2022-11-05 DIAGNOSIS — Z471 Aftercare following joint replacement surgery: Secondary | ICD-10-CM | POA: Diagnosis not present

## 2022-11-05 DIAGNOSIS — Z7982 Long term (current) use of aspirin: Secondary | ICD-10-CM | POA: Diagnosis not present

## 2022-11-05 DIAGNOSIS — Z96651 Presence of right artificial knee joint: Secondary | ICD-10-CM | POA: Diagnosis not present

## 2022-11-05 DIAGNOSIS — Z791 Long term (current) use of non-steroidal anti-inflammatories (NSAID): Secondary | ICD-10-CM | POA: Diagnosis not present

## 2022-11-06 DIAGNOSIS — Z7982 Long term (current) use of aspirin: Secondary | ICD-10-CM | POA: Diagnosis not present

## 2022-11-06 DIAGNOSIS — Z471 Aftercare following joint replacement surgery: Secondary | ICD-10-CM | POA: Diagnosis not present

## 2022-11-06 DIAGNOSIS — Z791 Long term (current) use of non-steroidal anti-inflammatories (NSAID): Secondary | ICD-10-CM | POA: Diagnosis not present

## 2022-11-06 DIAGNOSIS — Z96651 Presence of right artificial knee joint: Secondary | ICD-10-CM | POA: Diagnosis not present

## 2022-11-08 DIAGNOSIS — Z96651 Presence of right artificial knee joint: Secondary | ICD-10-CM | POA: Diagnosis not present

## 2022-11-08 DIAGNOSIS — Z7982 Long term (current) use of aspirin: Secondary | ICD-10-CM | POA: Diagnosis not present

## 2022-11-08 DIAGNOSIS — Z471 Aftercare following joint replacement surgery: Secondary | ICD-10-CM | POA: Diagnosis not present

## 2022-11-08 DIAGNOSIS — Z791 Long term (current) use of non-steroidal anti-inflammatories (NSAID): Secondary | ICD-10-CM | POA: Diagnosis not present

## 2022-11-09 DIAGNOSIS — Z7982 Long term (current) use of aspirin: Secondary | ICD-10-CM | POA: Diagnosis not present

## 2022-11-09 DIAGNOSIS — Z791 Long term (current) use of non-steroidal anti-inflammatories (NSAID): Secondary | ICD-10-CM | POA: Diagnosis not present

## 2022-11-09 DIAGNOSIS — Z96651 Presence of right artificial knee joint: Secondary | ICD-10-CM | POA: Diagnosis not present

## 2022-11-09 DIAGNOSIS — Z471 Aftercare following joint replacement surgery: Secondary | ICD-10-CM | POA: Diagnosis not present

## 2022-11-11 DIAGNOSIS — Z7982 Long term (current) use of aspirin: Secondary | ICD-10-CM | POA: Diagnosis not present

## 2022-11-11 DIAGNOSIS — Z471 Aftercare following joint replacement surgery: Secondary | ICD-10-CM | POA: Diagnosis not present

## 2022-11-11 DIAGNOSIS — Z791 Long term (current) use of non-steroidal anti-inflammatories (NSAID): Secondary | ICD-10-CM | POA: Diagnosis not present

## 2022-11-11 DIAGNOSIS — Z96651 Presence of right artificial knee joint: Secondary | ICD-10-CM | POA: Diagnosis not present

## 2022-11-12 DIAGNOSIS — R531 Weakness: Secondary | ICD-10-CM | POA: Diagnosis not present

## 2022-11-12 DIAGNOSIS — Z96651 Presence of right artificial knee joint: Secondary | ICD-10-CM | POA: Diagnosis not present

## 2022-11-12 DIAGNOSIS — M25661 Stiffness of right knee, not elsewhere classified: Secondary | ICD-10-CM | POA: Diagnosis not present

## 2022-11-12 DIAGNOSIS — M25561 Pain in right knee: Secondary | ICD-10-CM | POA: Diagnosis not present

## 2022-11-12 DIAGNOSIS — R600 Localized edema: Secondary | ICD-10-CM | POA: Diagnosis not present

## 2022-11-14 DIAGNOSIS — Z96651 Presence of right artificial knee joint: Secondary | ICD-10-CM | POA: Diagnosis not present

## 2022-11-14 DIAGNOSIS — R531 Weakness: Secondary | ICD-10-CM | POA: Diagnosis not present

## 2022-11-14 DIAGNOSIS — R600 Localized edema: Secondary | ICD-10-CM | POA: Diagnosis not present

## 2022-11-14 DIAGNOSIS — M25661 Stiffness of right knee, not elsewhere classified: Secondary | ICD-10-CM | POA: Diagnosis not present

## 2022-11-21 DIAGNOSIS — Z96651 Presence of right artificial knee joint: Secondary | ICD-10-CM | POA: Diagnosis not present

## 2022-11-21 DIAGNOSIS — R531 Weakness: Secondary | ICD-10-CM | POA: Diagnosis not present

## 2022-11-21 DIAGNOSIS — R600 Localized edema: Secondary | ICD-10-CM | POA: Diagnosis not present

## 2022-11-21 DIAGNOSIS — M25661 Stiffness of right knee, not elsewhere classified: Secondary | ICD-10-CM | POA: Diagnosis not present

## 2022-11-26 DIAGNOSIS — R531 Weakness: Secondary | ICD-10-CM | POA: Diagnosis not present

## 2022-11-26 DIAGNOSIS — R600 Localized edema: Secondary | ICD-10-CM | POA: Diagnosis not present

## 2022-11-26 DIAGNOSIS — Z96651 Presence of right artificial knee joint: Secondary | ICD-10-CM | POA: Diagnosis not present

## 2022-11-26 DIAGNOSIS — M25661 Stiffness of right knee, not elsewhere classified: Secondary | ICD-10-CM | POA: Diagnosis not present

## 2022-11-28 DIAGNOSIS — R531 Weakness: Secondary | ICD-10-CM | POA: Diagnosis not present

## 2022-11-28 DIAGNOSIS — R600 Localized edema: Secondary | ICD-10-CM | POA: Diagnosis not present

## 2022-11-28 DIAGNOSIS — M25661 Stiffness of right knee, not elsewhere classified: Secondary | ICD-10-CM | POA: Diagnosis not present

## 2022-11-28 DIAGNOSIS — Z96651 Presence of right artificial knee joint: Secondary | ICD-10-CM | POA: Diagnosis not present

## 2022-12-03 DIAGNOSIS — M25661 Stiffness of right knee, not elsewhere classified: Secondary | ICD-10-CM | POA: Diagnosis not present

## 2022-12-03 DIAGNOSIS — R600 Localized edema: Secondary | ICD-10-CM | POA: Diagnosis not present

## 2022-12-03 DIAGNOSIS — R531 Weakness: Secondary | ICD-10-CM | POA: Diagnosis not present

## 2022-12-03 DIAGNOSIS — Z96651 Presence of right artificial knee joint: Secondary | ICD-10-CM | POA: Diagnosis not present

## 2022-12-05 DIAGNOSIS — Z96651 Presence of right artificial knee joint: Secondary | ICD-10-CM | POA: Diagnosis not present

## 2022-12-05 DIAGNOSIS — M25661 Stiffness of right knee, not elsewhere classified: Secondary | ICD-10-CM | POA: Diagnosis not present

## 2022-12-05 DIAGNOSIS — R600 Localized edema: Secondary | ICD-10-CM | POA: Diagnosis not present

## 2022-12-05 DIAGNOSIS — R531 Weakness: Secondary | ICD-10-CM | POA: Diagnosis not present

## 2022-12-09 DIAGNOSIS — R531 Weakness: Secondary | ICD-10-CM | POA: Diagnosis not present

## 2022-12-09 DIAGNOSIS — Z96651 Presence of right artificial knee joint: Secondary | ICD-10-CM | POA: Diagnosis not present

## 2022-12-09 DIAGNOSIS — M25661 Stiffness of right knee, not elsewhere classified: Secondary | ICD-10-CM | POA: Diagnosis not present

## 2022-12-09 DIAGNOSIS — R600 Localized edema: Secondary | ICD-10-CM | POA: Diagnosis not present

## 2022-12-11 DIAGNOSIS — R600 Localized edema: Secondary | ICD-10-CM | POA: Diagnosis not present

## 2022-12-11 DIAGNOSIS — R531 Weakness: Secondary | ICD-10-CM | POA: Diagnosis not present

## 2022-12-11 DIAGNOSIS — M25661 Stiffness of right knee, not elsewhere classified: Secondary | ICD-10-CM | POA: Diagnosis not present

## 2022-12-11 DIAGNOSIS — Z96651 Presence of right artificial knee joint: Secondary | ICD-10-CM | POA: Diagnosis not present

## 2022-12-17 DIAGNOSIS — M25661 Stiffness of right knee, not elsewhere classified: Secondary | ICD-10-CM | POA: Diagnosis not present

## 2022-12-17 DIAGNOSIS — R600 Localized edema: Secondary | ICD-10-CM | POA: Diagnosis not present

## 2022-12-17 DIAGNOSIS — Z96651 Presence of right artificial knee joint: Secondary | ICD-10-CM | POA: Diagnosis not present

## 2022-12-17 DIAGNOSIS — R531 Weakness: Secondary | ICD-10-CM | POA: Diagnosis not present

## 2022-12-19 DIAGNOSIS — M25661 Stiffness of right knee, not elsewhere classified: Secondary | ICD-10-CM | POA: Diagnosis not present

## 2022-12-19 DIAGNOSIS — R531 Weakness: Secondary | ICD-10-CM | POA: Diagnosis not present

## 2022-12-19 DIAGNOSIS — Z96651 Presence of right artificial knee joint: Secondary | ICD-10-CM | POA: Diagnosis not present

## 2022-12-19 DIAGNOSIS — R600 Localized edema: Secondary | ICD-10-CM | POA: Diagnosis not present

## 2023-01-09 ENCOUNTER — Other Ambulatory Visit (HOSPITAL_COMMUNITY): Payer: Self-pay

## 2023-01-30 DIAGNOSIS — H35363 Drusen (degenerative) of macula, bilateral: Secondary | ICD-10-CM | POA: Diagnosis not present

## 2023-01-30 DIAGNOSIS — H524 Presbyopia: Secondary | ICD-10-CM | POA: Diagnosis not present

## 2023-01-30 DIAGNOSIS — H1045 Other chronic allergic conjunctivitis: Secondary | ICD-10-CM | POA: Diagnosis not present

## 2023-01-30 DIAGNOSIS — H40013 Open angle with borderline findings, low risk, bilateral: Secondary | ICD-10-CM | POA: Diagnosis not present

## 2023-01-30 DIAGNOSIS — H04123 Dry eye syndrome of bilateral lacrimal glands: Secondary | ICD-10-CM | POA: Diagnosis not present

## 2023-02-11 DIAGNOSIS — Z13820 Encounter for screening for osteoporosis: Secondary | ICD-10-CM | POA: Diagnosis not present

## 2023-02-11 DIAGNOSIS — Z01419 Encounter for gynecological examination (general) (routine) without abnormal findings: Secondary | ICD-10-CM | POA: Diagnosis not present

## 2023-02-11 DIAGNOSIS — Z139 Encounter for screening, unspecified: Secondary | ICD-10-CM | POA: Diagnosis not present

## 2023-02-11 DIAGNOSIS — Z124 Encounter for screening for malignant neoplasm of cervix: Secondary | ICD-10-CM | POA: Diagnosis not present

## 2023-02-11 DIAGNOSIS — N3281 Overactive bladder: Secondary | ICD-10-CM | POA: Diagnosis not present

## 2023-02-11 DIAGNOSIS — E559 Vitamin D deficiency, unspecified: Secondary | ICD-10-CM | POA: Diagnosis not present

## 2023-02-11 DIAGNOSIS — Z6828 Body mass index (BMI) 28.0-28.9, adult: Secondary | ICD-10-CM | POA: Diagnosis not present

## 2023-02-11 DIAGNOSIS — N952 Postmenopausal atrophic vaginitis: Secondary | ICD-10-CM | POA: Diagnosis not present

## 2023-02-11 DIAGNOSIS — Z1239 Encounter for other screening for malignant neoplasm of breast: Secondary | ICD-10-CM | POA: Diagnosis not present

## 2023-02-12 ENCOUNTER — Other Ambulatory Visit: Payer: Self-pay | Admitting: Obstetrics and Gynecology

## 2023-02-12 DIAGNOSIS — M810 Age-related osteoporosis without current pathological fracture: Secondary | ICD-10-CM

## 2023-02-18 ENCOUNTER — Other Ambulatory Visit: Payer: Self-pay | Admitting: Internal Medicine

## 2023-02-18 DIAGNOSIS — Z0000 Encounter for general adult medical examination without abnormal findings: Secondary | ICD-10-CM

## 2023-02-24 ENCOUNTER — Ambulatory Visit
Admission: RE | Admit: 2023-02-24 | Discharge: 2023-02-24 | Disposition: A | Discharge status: Home / Self Care | Payer: Medicare PPO | Source: Ambulatory Visit | Attending: Internal Medicine | Admitting: Internal Medicine

## 2023-02-24 DIAGNOSIS — Z0000 Encounter for general adult medical examination without abnormal findings: Secondary | ICD-10-CM

## 2023-02-24 DIAGNOSIS — Z1231 Encounter for screening mammogram for malignant neoplasm of breast: Secondary | ICD-10-CM | POA: Diagnosis not present

## 2023-03-13 DIAGNOSIS — R7989 Other specified abnormal findings of blood chemistry: Secondary | ICD-10-CM | POA: Diagnosis not present

## 2023-03-13 DIAGNOSIS — R5383 Other fatigue: Secondary | ICD-10-CM | POA: Diagnosis not present

## 2023-03-20 DIAGNOSIS — R3121 Asymptomatic microscopic hematuria: Secondary | ICD-10-CM | POA: Diagnosis not present

## 2023-03-20 DIAGNOSIS — D61818 Other pancytopenia: Secondary | ICD-10-CM | POA: Diagnosis not present

## 2023-03-20 DIAGNOSIS — R5383 Other fatigue: Secondary | ICD-10-CM | POA: Diagnosis not present

## 2023-03-20 DIAGNOSIS — R82998 Other abnormal findings in urine: Secondary | ICD-10-CM | POA: Diagnosis not present

## 2023-03-20 DIAGNOSIS — I872 Venous insufficiency (chronic) (peripheral): Secondary | ICD-10-CM | POA: Diagnosis not present

## 2023-03-20 DIAGNOSIS — Z0000 Encounter for general adult medical examination without abnormal findings: Secondary | ICD-10-CM | POA: Diagnosis not present

## 2023-03-20 DIAGNOSIS — R2681 Unsteadiness on feet: Secondary | ICD-10-CM | POA: Diagnosis not present

## 2023-03-20 DIAGNOSIS — Z1339 Encounter for screening examination for other mental health and behavioral disorders: Secondary | ICD-10-CM | POA: Diagnosis not present

## 2023-03-20 DIAGNOSIS — Z1331 Encounter for screening for depression: Secondary | ICD-10-CM | POA: Diagnosis not present

## 2023-04-07 ENCOUNTER — Other Ambulatory Visit: Payer: Self-pay

## 2023-04-07 ENCOUNTER — Other Ambulatory Visit: Payer: Self-pay | Admitting: Nurse Practitioner

## 2023-04-07 DIAGNOSIS — D61818 Other pancytopenia: Secondary | ICD-10-CM

## 2023-04-07 NOTE — Progress Notes (Unsigned)
Patient Care Team: Alexis Fillers, MD as PCP - General (Internal Medicine) Alexis Fillers, MD as Consulting Physician (Internal Medicine) Maldonado, Alexis Rayas, MD as Consulting Physician (Gastroenterology) Alexis Mood, MD as Consulting Physician (Hematology) Alexis Samples, NP as Nurse Practitioner (Nurse Practitioner)   CHIEF COMPLAINT: Follow up pancytopenia  CURRENT THERAPY: Oral B12 and multivitamin  INTERVAL HISTORY Alexis Maldonado returns for follow up as scheduled. Last seen by me 10/08/22. She continues oral B12 and multivitamin.  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  She is eating and drinking well without significant change in her weight.  Bowels moving.  Denies bleeding, pain, or any other new specific complaints.  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   Chronic pain of right knee    HYPERLIPIDEMIA 03/24/2008   INSOMNIA-SLEEP DISORDER-UNSPEC 01/24/2010   Left shoulder pain 10/08/2011   Lower back pain 10/13/2013   MENOPAUSAL DISORDER 03/24/2008   Qualifier: Diagnosis of  By: Jonny Ruiz MD, Len Blalock    Osteoarthritis    Pain and swelling of lower leg    bilateral lower extremities   SINUSITIS- ACUTE-NOS 07/27/2010   Qualifier: Diagnosis of  By: Jonny Ruiz MD, Len Blalock    Urinary urgency    Venous insufficiency    Vitamin D deficiency    Vulvitis      Past Surgical History:  Procedure Laterality Date   cataract surgery  2013/2014   with lens implantation   TUBAL LIGATION       Outpatient Encounter Medications as of 04/08/2023  Medication Sig   cyanocobalamin (VITAMIN B12) 1000 MCG tablet Take 1,000 mcg by mouth daily.   estradiol (ESTRACE)  0.1 MG/GM vaginal cream Place 2 g vaginally 2 (two) times a week.   fluticasone (FLONASE) 50 MCG/ACT nasal spray Place 2 sprays into both nostrils daily.   methylcellulose oral powder Take 1 packet by mouth daily.   Multiple Vitamin (MULTIVITAMIN WITH MINERALS) TABS tablet Take 1 tablet by mouth daily.   No facility-administered encounter medications on file as of 04/08/2023.     Today's Vitals   04/08/23 1141 04/08/23 1153  BP: 124/64   Pulse: 62   Resp: 16   Temp: (!) 97.4 F (36.3 C)   TempSrc: Oral   SpO2: 100%   Weight: 176 lb 9.6 oz (80.1 kg)   PainSc:  0-No pain   Body mass index is 29.39 kg/m.   PHYSICAL EXAM GENERAL:alert, no distress and comfortable SKIN: no rash  EYES: sclera clear NECK: without mass LYMPH:  no palpable cervical or supraclavicular lymphadenopathy  LUNGS: clear with normal breathing effort HEART: regular rate & rhythm, no lower extremity edema ABDOMEN: abdomen soft, non-tender and normal bowel sounds NEURO: alert & oriented x 3 with fluent speech, no focal motor/sensory deficits    CBC    Component Value Date/Time   WBC 3.0 (L) 04/08/2023 1112   WBC 4.3 (L) 10/13/2013 1718   RBC 3.46 (L) 04/08/2023 1112   HGB 11.1 (L) 04/08/2023 1112   HCT 33.3 (L) 04/08/2023 1112   HCT 35.9 04/08/2022 1245   PLT 120 (L) 04/08/2023  1112   MCV 96.2 04/08/2023 1112   MCH 32.1 04/08/2023 1112   MCHC 33.3 04/08/2023 1112   RDW 13.2 04/08/2023 1112   LYMPHSABS 1.2 04/08/2023 1112   MONOABS 0.3 04/08/2023 1112   EOSABS 0.1 04/08/2023 1112   BASOSABS 0.0 04/08/2023 1112     CMP     Component Value Date/Time   NA 141 04/08/2023 1112   K 3.8 04/08/2023 1112   CL 107 04/08/2023 1112   CO2 31 04/08/2023 1112   GLUCOSE 88 04/08/2023 1112   BUN 17 04/08/2023 1112   CREATININE 0.77 04/08/2023 1112   CALCIUM 9.2 04/08/2023 1112   PROT 7.0 04/08/2023 1112   ALBUMIN 4.0 04/08/2023 1112   AST 20 04/08/2023 1112   ALT 13 04/08/2023 1112   ALKPHOS 76  04/08/2023 1112   BILITOT 0.6 04/08/2023 1112   GFRNONAA >60 04/08/2023 1112   GFRAA 108 02/23/2008 0900     ASSESSMENT & PLAN:77 yo female with    Pancytopenia -She has had mild leukopenia and thrombocytopenia and intermittent anemia since at least 2010, WBC 3.6, hgb 11.5, and plt 119 in the past -She takes oral B12 and multivitamin  -we met her as new patient in 03/2022, our work up showed normal B12, folate, MMA, iron/TIBC, and negative Hep B/C/HIV serologies. Abdominal US showed normal spleen, and mildly increased liver echotexture.  -The etiology of her pancytopenia is not clear, although we have not done bone marrow biopsy, an indolent MDS is possible. No indication for treatment currently -She is on observation -[name] appears stable. Anemia and thrombocytopenia are stable. Leukopenia has been steadily declining, now with mild neutropenia ANC 1.4, which is new. She had 2 mild infection this year and knee replacement. She has recovered well.  -I recommend to repeat labs in 3 months, then phone f/up a week later.    2.Change in bowel habits, dark stool -Last colonoscopy 2017 showed internal hemorrhoids, otherwise normal  -Repeat (Maldonado) 06/20/22 showed adenomatous polyp, she would be due again in 7 years but she'll be 83 and so Dr. Adela Lank feels ok to forego further colonoscopies -04/08/23: Denies melena/hematochezia   3.Age appropriate health maintenance, wellness  -Per PCP Dr. Ivery Quale    PLAN: -Labs reviewed, anemia and thrombocytopenia are stable, new neutropenia -Close observation -Lab in 3 months, phone f/up 1 week later to determine if she needs bone marrow biopsy   Orders Placed This Encounter  Procedures   Ferritin    Standing Status:   Standing    Number of Occurrences:   1    Standing Expiration Date:   04/07/2024   Iron and Iron Binding Capacity ([facility] only)    Standing Status:   Standing    Number of Occurrences:   1    Standing  Expiration Date:   04/07/2024   Draw extra clot specimen    Standing Status:   Standing    Number of Occurrences:   1    Standing Expiration Date:   04/07/2024      All questions were answered. The patient knows to call the clinic with any problems, questions or concerns. No barriers to learning were detected. I spent 20 minutes counseling the patient face to face. The total time spent in the appointment was 30 minutes and more than 50% was on counseling, review of test results, and coordination of care.   Alexis Glad, NP-C 04/08/2023

## 2023-04-08 ENCOUNTER — Other Ambulatory Visit: Payer: Self-pay

## 2023-04-08 ENCOUNTER — Encounter: Payer: Self-pay | Admitting: Nurse Practitioner

## 2023-04-08 ENCOUNTER — Inpatient Hospital Stay: Payer: Medicare PPO | Admitting: Nurse Practitioner

## 2023-04-08 ENCOUNTER — Inpatient Hospital Stay: Payer: Medicare PPO | Attending: Nurse Practitioner

## 2023-04-08 VITALS — BP 124/64 | HR 62 | Temp 97.4°F | Resp 16 | Wt 176.6 lb

## 2023-04-08 DIAGNOSIS — D61818 Other pancytopenia: Secondary | ICD-10-CM | POA: Insufficient documentation

## 2023-04-08 DIAGNOSIS — D709 Neutropenia, unspecified: Secondary | ICD-10-CM | POA: Diagnosis not present

## 2023-04-08 DIAGNOSIS — D696 Thrombocytopenia, unspecified: Secondary | ICD-10-CM | POA: Diagnosis not present

## 2023-04-08 DIAGNOSIS — D649 Anemia, unspecified: Secondary | ICD-10-CM | POA: Diagnosis not present

## 2023-04-08 LAB — CMP (CANCER CENTER ONLY)
ALT: 13 U/L (ref 0–44)
AST: 20 U/L (ref 15–41)
Albumin: 4 g/dL (ref 3.5–5.0)
Alkaline Phosphatase: 76 U/L (ref 38–126)
Anion gap: 3 — ABNORMAL LOW (ref 5–15)
BUN: 17 mg/dL (ref 8–23)
CO2: 31 mmol/L (ref 22–32)
Calcium: 9.2 mg/dL (ref 8.9–10.3)
Chloride: 107 mmol/L (ref 98–111)
Creatinine: 0.77 mg/dL (ref 0.44–1.00)
GFR, Estimated: >60 mL/min (ref >60)
Glucose, Bld: 88 mg/dL (ref 70–99)
Potassium: 3.8 mmol/L (ref 3.5–5.1)
Sodium: 141 mmol/L (ref 135–145)
Total Bilirubin: 0.6 mg/dL (ref 0.3–1.2)
Total Protein: 7 g/dL (ref 6.5–8.1)

## 2023-04-08 LAB — CBC WITH DIFFERENTIAL (CANCER CENTER ONLY)
Abs Immature Granulocytes: 0.02 10*3/uL (ref 0.00–0.07)
Basophils Absolute: 0 10*3/uL (ref 0.0–0.1)
Basophils Relative: 1 %
Eosinophils Absolute: 0.1 10*3/uL (ref 0.0–0.5)
Eosinophils Relative: 2 %
HCT: 33.3 % — ABNORMAL LOW (ref 36.0–46.0)
Hemoglobin: 11.1 g/dL — ABNORMAL LOW (ref 12.0–15.0)
Immature Granulocytes: 1 %
Lymphocytes Relative: 40 %
Lymphs Abs: 1.2 10*3/uL (ref 0.7–4.0)
MCH: 32.1 pg (ref 26.0–34.0)
MCHC: 33.3 g/dL (ref 30.0–36.0)
MCV: 96.2 fL (ref 80.0–100.0)
Monocytes Absolute: 0.3 10*3/uL (ref 0.1–1.0)
Monocytes Relative: 9 %
Neutro Abs: 1.4 10*3/uL — ABNORMAL LOW (ref 1.7–7.7)
Neutrophils Relative %: 47 %
Platelet Count: 120 10*3/uL — ABNORMAL LOW (ref 150–400)
RBC: 3.46 MIL/uL — ABNORMAL LOW (ref 3.87–5.11)
RDW: 13.2 % (ref 11.5–15.5)
WBC Count: 3 10*3/uL — ABNORMAL LOW (ref 4.0–10.5)
nRBC: 0 % (ref 0.0–0.2)

## 2023-04-08 LAB — VITAMIN B12: Vitamin B-12: 1124 pg/mL — ABNORMAL HIGH (ref 180–914)

## 2023-05-01 DIAGNOSIS — M25561 Pain in right knee: Secondary | ICD-10-CM | POA: Diagnosis not present

## 2023-05-07 ENCOUNTER — Telehealth: Payer: Self-pay | Admitting: *Deleted

## 2023-05-07 NOTE — Telephone Encounter (Signed)
Pt called stating that Lacie had suggested that she have labs repeated in 6 wks but changed to 3 mo at her request.  She & husband have talked & she would like to do labs in next 2-3 wks as Lacie had suggested.  Informed that message would be sent to [facility] for OK to r/s.  Pt to have ph f/u after labs.  Message routed.

## 2023-05-09 ENCOUNTER — Telehealth: Payer: Self-pay | Admitting: Nurse Practitioner

## 2023-05-09 ENCOUNTER — Telehealth: Payer: Self-pay

## 2023-05-09 NOTE — Telephone Encounter (Signed)
Pt called to speak with Santiago Glad, NP about call made earlier this week.  Stated Clayborn Heron is aware of the pt's call earlier this week regarding her lab appt schedule.  Stated that Clayborn Heron has sent a message to her Scheduler to contact the pt to get her lab appts rescheduled to an earlier date.  Pt wanted to know if she could still speak with Lacie.  Informed pt that Clayborn Heron is out of the office on Friday but this nurse will make Lacie aware of the pt's call on Monday.  Pt became very tearful and stated "OK".  Call ended.  Notified Lacie of the pt's call.

## 2023-05-12 DIAGNOSIS — Z23 Encounter for immunization: Secondary | ICD-10-CM | POA: Diagnosis not present

## 2023-05-14 ENCOUNTER — Inpatient Hospital Stay: Payer: Medicare PPO | Attending: Nurse Practitioner

## 2023-05-14 DIAGNOSIS — D649 Anemia, unspecified: Secondary | ICD-10-CM | POA: Diagnosis not present

## 2023-05-14 DIAGNOSIS — D61818 Other pancytopenia: Secondary | ICD-10-CM | POA: Diagnosis not present

## 2023-05-14 LAB — CMP (CANCER CENTER ONLY)
ALT: 11 U/L (ref 0–44)
AST: 20 U/L (ref 15–41)
Albumin: 4.2 g/dL (ref 3.5–5.0)
Alkaline Phosphatase: 71 U/L (ref 38–126)
Anion gap: 3 — ABNORMAL LOW (ref 5–15)
BUN: 11 mg/dL (ref 8–23)
CO2: 33 mmol/L — ABNORMAL HIGH (ref 22–32)
Calcium: 9.8 mg/dL (ref 8.9–10.3)
Chloride: 106 mmol/L (ref 98–111)
Creatinine: 0.72 mg/dL (ref 0.44–1.00)
GFR, Estimated: >60 mL/min (ref >60)
Glucose, Bld: 86 mg/dL (ref 70–99)
Potassium: 3.8 mmol/L (ref 3.5–5.1)
Sodium: 142 mmol/L (ref 135–145)
Total Bilirubin: 0.6 mg/dL (ref 0.3–1.2)
Total Protein: 7.4 g/dL (ref 6.5–8.1)

## 2023-05-14 LAB — IRON AND IRON BINDING CAPACITY (CC-WL,HP ONLY)
Iron: 69 ug/dL (ref 28–170)
Saturation Ratios: 21 % (ref 10.4–31.8)
TIBC: 329 ug/dL (ref 250–450)
UIBC: 260 ug/dL (ref 148–442)

## 2023-05-14 LAB — CBC WITH DIFFERENTIAL (CANCER CENTER ONLY)
Abs Immature Granulocytes: 0.01 10*3/uL (ref 0.00–0.07)
Basophils Absolute: 0 10*3/uL (ref 0.0–0.1)
Basophils Relative: 1 %
Eosinophils Absolute: 0.1 10*3/uL (ref 0.0–0.5)
Eosinophils Relative: 2 %
HCT: 34.7 % — ABNORMAL LOW (ref 36.0–46.0)
Hemoglobin: 11.2 g/dL — ABNORMAL LOW (ref 12.0–15.0)
Immature Granulocytes: 0 %
Lymphocytes Relative: 31 %
Lymphs Abs: 1.1 10*3/uL (ref 0.7–4.0)
MCH: 31.6 pg (ref 26.0–34.0)
MCHC: 32.3 g/dL (ref 30.0–36.0)
MCV: 98 fL (ref 80.0–100.0)
Monocytes Absolute: 0.3 10*3/uL (ref 0.1–1.0)
Monocytes Relative: 9 %
Neutro Abs: 2 10*3/uL (ref 1.7–7.7)
Neutrophils Relative %: 57 %
Platelet Count: 133 10*3/uL — ABNORMAL LOW (ref 150–400)
RBC: 3.54 MIL/uL — ABNORMAL LOW (ref 3.87–5.11)
RDW: 12.7 % (ref 11.5–15.5)
WBC Count: 3.4 10*3/uL — ABNORMAL LOW (ref 4.0–10.5)
nRBC: 0 % (ref 0.0–0.2)

## 2023-05-14 LAB — FERRITIN: Ferritin: 135 ng/mL (ref 11–307)

## 2023-05-14 LAB — VITAMIN B12: Vitamin B-12: 2019 pg/mL — ABNORMAL HIGH (ref 180–914)

## 2023-05-20 ENCOUNTER — Inpatient Hospital Stay (HOSPITAL_BASED_OUTPATIENT_CLINIC_OR_DEPARTMENT_OTHER): Payer: Medicare PPO | Admitting: Nurse Practitioner

## 2023-05-20 ENCOUNTER — Encounter: Payer: Self-pay | Admitting: Nurse Practitioner

## 2023-05-20 DIAGNOSIS — D61818 Other pancytopenia: Secondary | ICD-10-CM | POA: Diagnosis not present

## 2023-05-20 NOTE — Progress Notes (Signed)
Patient Care Team: Garlan Fillers, MD as PCP - General (Internal Medicine) Garlan Fillers, MD as Consulting Physician (Internal Medicine) Armbruster, Willaim Rayas, MD as Consulting Physician (Gastroenterology) Malachy Mood, MD as Consulting Physician (Hematology) Pollyann Samples, NP as Nurse Practitioner (Nurse Practitioner)   I connected with Alexis Maldonado on 05/20/23 at 10:00 AM EDT by telephone visit and verified that I am speaking with the correct person using two identifiers.   I discussed the limitations, risks, security and privacy concerns of performing an evaluation and management service by telemedicine and the availability of in-person appointments. I also discussed with the patient that there may be a patient responsible charge related to this service. The patient expressed understanding and agreed to proceed.   Other persons participating in the visit and their role in the encounter: None   Patient's location: home  Provider's location: [facility] office   CHIEF COMPLAINT: Follow up pancytopenia, recent lab review  CURRENT THERAPY: Oral B12, multivitamin, observation  INTERVAL HISTORY Alexis Maldonado presents by phone to review recent blood work.  She feels well overall, still recovering from her knee surgery but very mobile and active.  Takes her sister for errands.  Gets tired in the evenings.  She has some itching in the right wrist, no other rash.  Denies bleeding.  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   Chronic pain of right knee    HYPERLIPIDEMIA 03/24/2008   INSOMNIA-SLEEP DISORDER-UNSPEC 01/24/2010   Left shoulder pain 10/08/2011   Lower back pain 10/13/2013   MENOPAUSAL DISORDER 03/24/2008   Qualifier: Diagnosis of  By: Jonny Ruiz MD, Len Blalock     Osteoarthritis    Pain and swelling of lower leg    bilateral lower extremities   SINUSITIS- ACUTE-NOS 07/27/2010   Qualifier: Diagnosis of  By: Jonny Ruiz MD, Len Blalock    Urinary urgency    Venous insufficiency    Vitamin D deficiency    Vulvitis      Past Surgical History:  Procedure Laterality Date   cataract surgery  2013/2014   with lens implantation   TUBAL LIGATION       Outpatient Encounter Medications as of 05/20/2023  Medication Sig   cyanocobalamin (VITAMIN B12) 1000 MCG tablet Take 1,000 mcg by mouth daily.   estradiol (ESTRACE) 0.1 MG/GM vaginal cream Place 2 g vaginally 2 (two) times a week.   fluticasone (FLONASE) 50 MCG/ACT nasal Maldonado Place 2 sprays into both nostrils daily.   methylcellulose oral powder Take 1 packet by mouth daily.   Multiple Vitamin (MULTIVITAMIN WITH MINERALS) TABS tablet Take 1 tablet by mouth daily.   No facility-administered encounter medications on file as of 05/20/2023.     There were no vitals filed for this visit. There is no height or weight on file to calculate BMI.   PHYSICAL EXAM Phone visit  CBC    Component Value Date/Time   WBC 3.4 (L) 05/14/2023 1027   WBC 4.3 (L) 10/13/2013 1718   RBC 3.54 (L) 05/14/2023 1027   HGB 11.2 (L) 05/14/2023 1027   HCT 34.7 (L) 05/14/2023 1027   HCT 35.9 04/08/2022 1245   PLT 133 (L) 05/14/2023 1027   MCV 98.0 05/14/2023 1027   MCH  31.6 05/14/2023 1027   MCHC 32.3 05/14/2023 1027   RDW 12.7 05/14/2023 1027   LYMPHSABS 1.1 05/14/2023 1027   MONOABS 0.3 05/14/2023 1027   EOSABS 0.1 05/14/2023 1027   BASOSABS 0.0 05/14/2023 1027     CMP     Component Value Date/Time   NA 142 05/14/2023 1027   K 3.8 05/14/2023 1027   CL 106 05/14/2023 1027   CO2 33 (H) 05/14/2023 1027   GLUCOSE 86 05/14/2023 1027   BUN 11 05/14/2023 1027   CREATININE 0.72 05/14/2023 1027   CALCIUM 9.8 05/14/2023 1027   PROT 7.4 05/14/2023 1027   ALBUMIN 4.2 05/14/2023 1027   AST 20 05/14/2023 1027   ALT 11  05/14/2023 1027   ALKPHOS 71 05/14/2023 1027   BILITOT 0.6 05/14/2023 1027   GFRNONAA >60 05/14/2023 1027   GFRAA 108 02/23/2008 0900     ASSESSMENT & PLAN:77 yo female with    Pancytopenia -She has had mild leukopenia and thrombocytopenia and intermittent anemia since at least 2010, WBC 3.6, hgb 11.5, and plt 119 in the past -She takes oral B12 and multivitamin  -we met her as new patient in 03/2022, our work up showed normal/borderline B12, folate, MMA, iron/TIBC, and negative Hep B/C/HIV serologies. Abdominal US showed normal spleen, and mildly increased liver echotexture.  -The etiology of her pancytopenia is not clear, although we have not done bone marrow biopsy, an indolent MDS is possible. No indication for treatment currently -She is on observation and oral B12 -Last visit 04/08/2023 she had neutropenia; repeat labs from 05/14/2023 are improved, normal ANC, mild thrombocytopenia, anemia, and leukopenia -We reviewed the possibility of an indolent MDS, but not recommending bone marrow biopsy at this time, because even if confirmed she would not need treatment at these levels -Continue observation and pancytopenic precautions -Lab in 4 and 8 months with follow-up in 8 months, or sooner if needed   2.Change in bowel habits, dark stool -Last colonoscopy 2017 showed internal hemorrhoids, otherwise normal  -Repeat (Armbruster) 06/20/22 showed adenomatous polyp, she would be due again in 7 years but she'll be 83 and so Dr. Adela Lank feels ok to forego further colonoscopies -04/08/23: Denies melena/hematochezia   3.Age appropriate health maintenance, wellness  -Per PCP Dr. Ivery Quale    PLAN: -Labs reviewed independently and with Dr. Mosetta Putt, and discussed with pt -Continue observation for pancytopenia, hold bone marrow biopsy  -Okay to hold oral B12 and continue women's daily multivitamin -Lab in 4 and 8 months -Follow-up in 8 months, or sooner if needed.  She knows to call with  infection, bleeding, or signs of worsening anemia    I discussed the assessment and treatment plan with the patient. The patient was provided an opportunity to ask questions and all were answered. The patient agreed with the plan and demonstrated an understanding of the instructions.   The patient was advised to call back or seek an in-person evaluation if the symptoms worsen or if the condition fails to improve as anticipated.  The total time spent in the appointment was 15 minutes and more than 50% was on counseling, review of test results, and coordination of care.   Alexis Glad, NP-C 05/20/2023

## 2023-07-08 ENCOUNTER — Other Ambulatory Visit: Payer: Medicare PPO

## 2023-07-15 ENCOUNTER — Telehealth: Payer: Medicare PPO | Admitting: Nurse Practitioner

## 2023-09-17 ENCOUNTER — Inpatient Hospital Stay: Payer: Medicare PPO | Attending: Nurse Practitioner

## 2023-09-17 DIAGNOSIS — D61818 Other pancytopenia: Secondary | ICD-10-CM | POA: Insufficient documentation

## 2023-09-17 LAB — CBC WITH DIFFERENTIAL (CANCER CENTER ONLY)
Abs Immature Granulocytes: 0 10*3/uL (ref 0.00–0.07)
Basophils Absolute: 0 10*3/uL (ref 0.0–0.1)
Basophils Relative: 1 %
Eosinophils Absolute: 0.1 10*3/uL (ref 0.0–0.5)
Eosinophils Relative: 3 %
HCT: 38.6 % (ref 36.0–46.0)
Hemoglobin: 12.2 g/dL (ref 12.0–15.0)
Immature Granulocytes: 0 %
Lymphocytes Relative: 36 %
Lymphs Abs: 1.1 10*3/uL (ref 0.7–4.0)
MCH: 31 pg (ref 26.0–34.0)
MCHC: 31.6 g/dL (ref 30.0–36.0)
MCV: 98 fL (ref 80.0–100.0)
Monocytes Absolute: 0.3 10*3/uL (ref 0.1–1.0)
Monocytes Relative: 9 %
Neutro Abs: 1.6 10*3/uL — ABNORMAL LOW (ref 1.7–7.7)
Neutrophils Relative %: 51 %
Platelet Count: 119 10*3/uL — ABNORMAL LOW (ref 150–400)
RBC: 3.94 MIL/uL (ref 3.87–5.11)
RDW: 12.3 % (ref 11.5–15.5)
WBC Count: 3.1 10*3/uL — ABNORMAL LOW (ref 4.0–10.5)
nRBC: 0 % (ref 0.0–0.2)

## 2023-09-17 LAB — CMP (CANCER CENTER ONLY)
ALT: 15 U/L (ref 0–44)
AST: 25 U/L (ref 15–41)
Albumin: 4.1 g/dL (ref 3.5–5.0)
Alkaline Phosphatase: 85 U/L (ref 38–126)
Anion gap: 4 — ABNORMAL LOW (ref 5–15)
BUN: 16 mg/dL (ref 8–23)
CO2: 31 mmol/L (ref 22–32)
Calcium: 9.8 mg/dL (ref 8.9–10.3)
Chloride: 105 mmol/L (ref 98–111)
Creatinine: 0.76 mg/dL (ref 0.44–1.00)
GFR, Estimated: >60 mL/min (ref >60)
Glucose, Bld: 90 mg/dL (ref 70–99)
Potassium: 4.1 mmol/L (ref 3.5–5.1)
Sodium: 140 mmol/L (ref 135–145)
Total Bilirubin: 0.7 mg/dL (ref 0.0–1.2)
Total Protein: 7.6 g/dL (ref 6.5–8.1)

## 2023-09-17 LAB — VITAMIN B12: Vitamin B-12: 706 pg/mL (ref 180–914)

## 2023-11-11 DIAGNOSIS — H40013 Open angle with borderline findings, low risk, bilateral: Secondary | ICD-10-CM | POA: Diagnosis not present

## 2023-11-24 DIAGNOSIS — D61818 Other pancytopenia: Secondary | ICD-10-CM | POA: Diagnosis not present

## 2023-11-24 DIAGNOSIS — E538 Deficiency of other specified B group vitamins: Secondary | ICD-10-CM | POA: Diagnosis not present

## 2023-11-24 DIAGNOSIS — M79601 Pain in right arm: Secondary | ICD-10-CM | POA: Diagnosis not present

## 2023-11-24 DIAGNOSIS — D696 Thrombocytopenia, unspecified: Secondary | ICD-10-CM | POA: Diagnosis not present

## 2023-12-19 ENCOUNTER — Telehealth: Payer: Self-pay | Admitting: Hematology

## 2023-12-24 ENCOUNTER — Ambulatory Visit: Payer: Medicare PPO | Admitting: Hematology

## 2023-12-24 ENCOUNTER — Other Ambulatory Visit: Payer: Medicare PPO

## 2024-01-06 NOTE — Progress Notes (Unsigned)
 [facility]     Telephone:(336) [phone] Fax:(336) [phone]    Patient Care Team: Bertha Broad, MD as PCP - General (Internal Medicine) Bertha Broad, MD as Consulting Physician (Internal Medicine) Armbruster, Lendon Queen, MD as Consulting Physician (Gastroenterology) Sonja Bantam, MD as Consulting Physician (Hematology) Lacole Komorowski K, NP as Nurse Practitioner (Nurse Practitioner)   CHIEF COMPLAINT: Follow up pancytopenia  CURRENT THERAPY: Oral B12, multivitamin  INTERVAL HISTORY Ms. Perrette returns for follow up as scheduled. Last seen by me 05/20/23. Doing well overall, was feeling sluggish but better in the past 4-5 weeks. Still gets tired in the evenings but remains functional and active. Denies recurrent infections, fever, drenching night sweats, unintentional weight loss, bleeding, pain.   ROS  All other systems reviewed and negative  Past Medical History:  Diagnosis Date   ALLERGIC RHINITIS 03/24/2008   Anemia    ANXIETY 01/24/2010   BACK PAIN 10/02/2010   Qualifier: Diagnosis of  By: Autry Legions MD, Alveda Aures    Cervical spine degeneration 10/08/2011   Chronic pain of right knee    HYPERLIPIDEMIA 03/24/2008   INSOMNIA-SLEEP DISORDER-UNSPEC 01/24/2010   Left shoulder pain 10/08/2011   Lower back pain 10/13/2013   MENOPAUSAL DISORDER 03/24/2008   Qualifier: Diagnosis of  By: Autry Legions MD, Alveda Aures    Osteoarthritis    Pain and swelling of lower leg    bilateral lower extremities   SINUSITIS- ACUTE-NOS 07/27/2010   Qualifier: Diagnosis of  By: Autry Legions MD, Alveda Aures    Urinary urgency    Venous insufficiency    Vitamin D deficiency    Vulvitis      Past Surgical History:  Procedure Laterality Date   cataract surgery  2013/2014   with lens implantation   TUBAL LIGATION       Outpatient Encounter Medications as of 01/07/2024  Medication Sig   cyanocobalamin  (VITAMIN B12) 1000 MCG tablet Take 1,000 mcg by mouth daily.   estradiol (ESTRACE) 0.1 MG/GM  vaginal cream Place 2 g vaginally 2 (two) times a week.   fluticasone  (FLONASE ) 50 MCG/ACT nasal spray Place 2 sprays into both nostrils daily.   methylcellulose oral powder Take 1 packet by mouth daily.   Multiple Vitamin (MULTIVITAMIN WITH MINERALS) TABS tablet Take 1 tablet by mouth daily.   No facility-administered encounter medications on file as of 01/07/2024.     Today's Vitals   01/07/24 1058  BP: 128/70  Pulse: 62  Resp: 18  Temp: 97.7 F (36.5 C)  TempSrc: Temporal  SpO2: 99%  Weight: 175 lb 12.8 oz (79.7 kg)   Body mass index is 29.25 kg/m.   ECOG PERFORMANCE STATUS: 0 - Asymptomatic  PHYSICAL EXAM GENERAL:alert, no distress and comfortable SKIN: no rash  EYES: sclera clear NECK: without mass LYMPH:  no palpable cervical or supraclavicular lymphadenopathy  LUNGS: clear with normal breathing effort HEART: regular rate & rhythm, no lower extremity edema ABDOMEN: abdomen soft, non-tender and normal bowel sounds NEURO: alert & oriented x 3 with fluent speech, no focal motor/sensory deficits Breast exam:  PAC without erythema    CBC    Latest Ref Rng & Units 01/07/2024   10:38 AM 09/17/2023   10:19 AM 05/14/2023   10:27 AM  CBC  WBC 4.0 - 10.5 K/uL 3.8  3.1  3.4   Hemoglobin 12.0 - 15.0 g/dL 45.4  09.8  11.9   Hematocrit 36.0 - 46.0 % 35.5  38.6  34.7  Platelets 150 - 400 K/uL 112  119  133       CMP     Latest Ref Rng & Units 01/07/2024   10:38 AM 09/17/2023   10:19 AM 05/14/2023   10:27 AM  CMP  Glucose 70 - 99 mg/dL 86  90  86   BUN 8 - 23 mg/dL 17  16  11    Creatinine 0.44 - 1.00 mg/dL 1.61  0.96  0.45   Sodium 135 - 145 mmol/L 141  140  142   Potassium 3.5 - 5.1 mmol/L 4.0  4.1  3.8   Chloride 98 - 111 mmol/L 106  105  106   CO2 22 - 32 mmol/L 32  31  33   Calcium 8.9 - 10.3 mg/dL 9.8  9.8  9.8   Total Protein 6.5 - 8.1 g/dL 7.5  7.6  7.4   Total Bilirubin 0.0 - 1.2 mg/dL 0.7  0.7  0.6   Alkaline Phos 38 - 126 U/L 74  85  71   AST 15 - 41 U/L  21  25  20    ALT 0 - 44 U/L 13  15  11        ASSESSMENT & PLAN: 78 yo female with    Pancytopenia -She has had mild leukopenia and thrombocytopenia and intermittent anemia since at least 2010, WBC 3.6, hgb 11.5, and plt 119 in the past -we met her as new patient in 03/2022, our work up showed normal/borderline B12, folate, MMA, iron/TIBC, and negative Hep B/C/HIV serologies. Abdominal US  showed normal spleen, and mildly increased liver echotexture.  -The etiology of her pancytopenia is not clear, although we have not done bone marrow biopsy, an indolent MDS is possible. No indication for treatment currently -PCP took her off iron and B12 in the past year, she is on observation  -Ms. Nienaber appears stable. Persistent mild pancytopenia but remains asymptomatic. Will f/up pending B12 level from today. I suggested to take a prenatal vitamin -Previously reviewed the possibility of an indolent MDS, but not recommending bone marrow biopsy at this time, because even if confirmed she would not need treatment at these levels -Continue observation, lab in 6 months, f/up in 1 year - She knows to call sooner with unintentional weight loss, extreme fatigue, drenching night sweats, recurrent of infection, or bleeding    Age appropriate health maintenance, wellness  -Per PCP Dr. Tildon Folds      PLAN: - Labs reviewed, B12 is pending -Consider starting a prenatal vitamin -Lab every 6 months -Follow-up in 1 year, or sooner if needed   All questions were answered. The patient knows to call the clinic with any problems, questions or concerns. No barriers to learning were detected.   Anona Giovannini K Osa Campoli, NP 01/07/2024

## 2024-01-07 ENCOUNTER — Encounter: Payer: Self-pay | Admitting: Nurse Practitioner

## 2024-01-07 ENCOUNTER — Inpatient Hospital Stay (HOSPITAL_BASED_OUTPATIENT_CLINIC_OR_DEPARTMENT_OTHER): Admitting: Nurse Practitioner

## 2024-01-07 ENCOUNTER — Inpatient Hospital Stay: Attending: Nurse Practitioner

## 2024-01-07 VITALS — BP 128/70 | HR 62 | Temp 97.7°F | Resp 18 | Wt 175.8 lb

## 2024-01-07 DIAGNOSIS — D61818 Other pancytopenia: Secondary | ICD-10-CM | POA: Insufficient documentation

## 2024-01-07 DIAGNOSIS — D696 Thrombocytopenia, unspecified: Secondary | ICD-10-CM | POA: Insufficient documentation

## 2024-01-07 DIAGNOSIS — D72819 Decreased white blood cell count, unspecified: Secondary | ICD-10-CM | POA: Insufficient documentation

## 2024-01-07 DIAGNOSIS — D649 Anemia, unspecified: Secondary | ICD-10-CM | POA: Diagnosis not present

## 2024-01-07 LAB — CBC WITH DIFFERENTIAL (CANCER CENTER ONLY)
Abs Immature Granulocytes: 0 10*3/uL (ref 0.00–0.07)
Basophils Absolute: 0 10*3/uL (ref 0.0–0.1)
Basophils Relative: 1 %
Eosinophils Absolute: 0.1 10*3/uL (ref 0.0–0.5)
Eosinophils Relative: 2 %
HCT: 35.5 % — ABNORMAL LOW (ref 36.0–46.0)
Hemoglobin: 11.5 g/dL — ABNORMAL LOW (ref 12.0–15.0)
Immature Granulocytes: 0 %
Lymphocytes Relative: 37 %
Lymphs Abs: 1.4 10*3/uL (ref 0.7–4.0)
MCH: 31.2 pg (ref 26.0–34.0)
MCHC: 32.4 g/dL (ref 30.0–36.0)
MCV: 96.2 fL (ref 80.0–100.0)
Monocytes Absolute: 0.4 10*3/uL (ref 0.1–1.0)
Monocytes Relative: 10 %
Neutro Abs: 1.9 10*3/uL (ref 1.7–7.7)
Neutrophils Relative %: 50 %
Platelet Count: 112 10*3/uL — ABNORMAL LOW (ref 150–400)
RBC: 3.69 MIL/uL — ABNORMAL LOW (ref 3.87–5.11)
RDW: 12.5 % (ref 11.5–15.5)
WBC Count: 3.8 10*3/uL — ABNORMAL LOW (ref 4.0–10.5)
nRBC: 0 % (ref 0.0–0.2)

## 2024-01-07 LAB — CMP (CANCER CENTER ONLY)
ALT: 13 U/L (ref 0–44)
AST: 21 U/L (ref 15–41)
Albumin: 4.3 g/dL (ref 3.5–5.0)
Alkaline Phosphatase: 74 U/L (ref 38–126)
Anion gap: 3 — ABNORMAL LOW (ref 5–15)
BUN: 17 mg/dL (ref 8–23)
CO2: 32 mmol/L (ref 22–32)
Calcium: 9.8 mg/dL (ref 8.9–10.3)
Chloride: 106 mmol/L (ref 98–111)
Creatinine: 0.81 mg/dL (ref 0.44–1.00)
GFR, Estimated: >60 mL/min (ref >60)
Glucose, Bld: 86 mg/dL (ref 70–99)
Potassium: 4 mmol/L (ref 3.5–5.1)
Sodium: 141 mmol/L (ref 135–145)
Total Bilirubin: 0.7 mg/dL (ref 0.0–1.2)
Total Protein: 7.5 g/dL (ref 6.5–8.1)

## 2024-01-07 LAB — VITAMIN B12: Vitamin B-12: 810 pg/mL (ref 180–914)

## 2024-01-12 ENCOUNTER — Ambulatory Visit: Payer: Self-pay | Admitting: Nurse Practitioner

## 2024-01-21 ENCOUNTER — Other Ambulatory Visit: Payer: Medicare PPO

## 2024-01-21 ENCOUNTER — Ambulatory Visit: Payer: Medicare PPO | Admitting: Nurse Practitioner

## 2024-04-06 DIAGNOSIS — R7989 Other specified abnormal findings of blood chemistry: Secondary | ICD-10-CM | POA: Diagnosis not present

## 2024-04-06 DIAGNOSIS — D649 Anemia, unspecified: Secondary | ICD-10-CM | POA: Diagnosis not present

## 2024-04-06 DIAGNOSIS — D696 Thrombocytopenia, unspecified: Secondary | ICD-10-CM | POA: Diagnosis not present

## 2024-04-13 DIAGNOSIS — Z0000 Encounter for general adult medical examination without abnormal findings: Secondary | ICD-10-CM | POA: Diagnosis not present

## 2024-04-13 DIAGNOSIS — R82998 Other abnormal findings in urine: Secondary | ICD-10-CM | POA: Diagnosis not present

## 2024-04-13 DIAGNOSIS — Z1331 Encounter for screening for depression: Secondary | ICD-10-CM | POA: Diagnosis not present

## 2024-04-13 DIAGNOSIS — J309 Allergic rhinitis, unspecified: Secondary | ICD-10-CM | POA: Diagnosis not present

## 2024-04-13 DIAGNOSIS — R5383 Other fatigue: Secondary | ICD-10-CM | POA: Diagnosis not present

## 2024-04-13 DIAGNOSIS — G8929 Other chronic pain: Secondary | ICD-10-CM | POA: Diagnosis not present

## 2024-04-13 DIAGNOSIS — M25511 Pain in right shoulder: Secondary | ICD-10-CM | POA: Diagnosis not present

## 2024-04-13 DIAGNOSIS — D61818 Other pancytopenia: Secondary | ICD-10-CM | POA: Diagnosis not present

## 2024-04-13 DIAGNOSIS — N3281 Overactive bladder: Secondary | ICD-10-CM | POA: Diagnosis not present

## 2024-04-13 DIAGNOSIS — Z1389 Encounter for screening for other disorder: Secondary | ICD-10-CM | POA: Diagnosis not present

## 2024-04-13 DIAGNOSIS — I872 Venous insufficiency (chronic) (peripheral): Secondary | ICD-10-CM | POA: Diagnosis not present

## 2024-04-13 DIAGNOSIS — Z1339 Encounter for screening examination for other mental health and behavioral disorders: Secondary | ICD-10-CM | POA: Diagnosis not present

## 2024-04-13 DIAGNOSIS — Z79899 Other long term (current) drug therapy: Secondary | ICD-10-CM | POA: Diagnosis not present

## 2024-05-07 ENCOUNTER — Other Ambulatory Visit: Payer: Self-pay | Admitting: Internal Medicine

## 2024-05-07 DIAGNOSIS — Z1231 Encounter for screening mammogram for malignant neoplasm of breast: Secondary | ICD-10-CM

## 2024-06-01 ENCOUNTER — Ambulatory Visit
Admission: RE | Admit: 2024-06-01 | Discharge: 2024-06-01 | Disposition: A | Discharge status: Home / Self Care | Source: Ambulatory Visit | Attending: Internal Medicine | Admitting: Internal Medicine

## 2024-06-01 DIAGNOSIS — Z1231 Encounter for screening mammogram for malignant neoplasm of breast: Secondary | ICD-10-CM

## 2024-06-18 DIAGNOSIS — Z23 Encounter for immunization: Secondary | ICD-10-CM | POA: Diagnosis not present

## 2024-08-24 ENCOUNTER — Other Ambulatory Visit (HOSPITAL_COMMUNITY): Payer: Self-pay

## 2024-08-24 MED ORDER — COMIRNATY 30 MCG/0.3ML IM SUSY
0.3000 mL | PREFILLED_SYRINGE | Freq: Once | INTRAMUSCULAR | 0 refills | Status: AC
  Filled 2024-08-24: qty 0.3, 1d supply, fill #0

## 2024-08-24 MED ORDER — RSV PRE-FUSION F A&B VAC RCMB 120 MCG/0.5ML IM SOLR
0.5000 mL | Freq: Once | INTRAMUSCULAR | 0 refills | Status: AC
  Filled 2024-08-24: qty 0.5, 1d supply, fill #0
